# Patient Record
Sex: Female | Born: 1960
Health system: Southern US, Community
[De-identification: ages and names within clinical notes are randomized; demographics above are authoritative.]

## PROBLEM LIST (undated history)

## (undated) DIAGNOSIS — R7303 Prediabetes: Secondary | ICD-10-CM

## (undated) DIAGNOSIS — Z9989 Dependence on other enabling machines and devices: Secondary | ICD-10-CM

## (undated) DIAGNOSIS — G4730 Sleep apnea, unspecified: Secondary | ICD-10-CM

## (undated) DIAGNOSIS — E119 Type 2 diabetes mellitus without complications: Secondary | ICD-10-CM

## (undated) DIAGNOSIS — G43909 Migraine, unspecified, not intractable, without status migrainosus: Secondary | ICD-10-CM

## (undated) DIAGNOSIS — G4733 Obstructive sleep apnea (adult) (pediatric): Secondary | ICD-10-CM

## (undated) DIAGNOSIS — M1990 Unspecified osteoarthritis, unspecified site: Secondary | ICD-10-CM

## (undated) DIAGNOSIS — K219 Gastro-esophageal reflux disease without esophagitis: Secondary | ICD-10-CM

## (undated) DIAGNOSIS — N390 Urinary tract infection, site not specified: Secondary | ICD-10-CM

## (undated) DIAGNOSIS — I10 Essential (primary) hypertension: Secondary | ICD-10-CM

## (undated) DIAGNOSIS — H269 Unspecified cataract: Secondary | ICD-10-CM

## (undated) DIAGNOSIS — U071 COVID-19: Secondary | ICD-10-CM

## (undated) HISTORY — DX: Sleep apnea, unspecified: G47.30

## (undated) HISTORY — DX: Gastro-esophageal reflux disease without esophagitis: K21.9

## (undated) HISTORY — DX: Unspecified cataract: H26.9

## (undated) HISTORY — DX: Type 2 diabetes mellitus without complications: E11.9

## (undated) HISTORY — DX: COVID-19: U07.1

## (undated) HISTORY — DX: Prediabetes: R73.03

## (undated) HISTORY — DX: Unspecified osteoarthritis, unspecified site: M19.90

## (undated) HISTORY — DX: Obstructive sleep apnea (adult) (pediatric): G47.33

## (undated) HISTORY — PX: ABDOMINOPLASTY: SUR9

## (undated) HISTORY — DX: Essential (primary) hypertension: I10

## (undated) HISTORY — DX: Migraine, unspecified, not intractable, without status migrainosus: G43.909

## (undated) HISTORY — DX: Dependence on other enabling machines and devices: Z99.89

## (undated) HISTORY — PX: ABDOMINAL HYSTERECTOMY: SHX81

## (undated) HISTORY — DX: Urinary tract infection, site not specified: N39.0

---

## 2005-08-10 ENCOUNTER — Ambulatory Visit: Payer: Self-pay

## 2006-08-20 ENCOUNTER — Ambulatory Visit: Payer: Self-pay

## 2007-08-27 ENCOUNTER — Ambulatory Visit: Payer: Self-pay

## 2008-09-16 ENCOUNTER — Ambulatory Visit: Payer: Self-pay

## 2009-10-05 ENCOUNTER — Ambulatory Visit: Payer: Self-pay

## 2010-08-09 ENCOUNTER — Ambulatory Visit: Payer: Self-pay | Admitting: Unknown Physician Specialty

## 2010-11-02 ENCOUNTER — Ambulatory Visit: Payer: Self-pay

## 2011-10-17 ENCOUNTER — Ambulatory Visit: Payer: Self-pay | Admitting: Unknown Physician Specialty

## 2012-10-29 ENCOUNTER — Ambulatory Visit: Payer: Self-pay | Admitting: Unknown Physician Specialty

## 2013-11-02 ENCOUNTER — Ambulatory Visit: Payer: Self-pay | Admitting: Internal Medicine

## 2014-02-16 ENCOUNTER — Ambulatory Visit: Payer: Self-pay | Admitting: Specialist

## 2014-09-30 DIAGNOSIS — K219 Gastro-esophageal reflux disease without esophagitis: Secondary | ICD-10-CM | POA: Insufficient documentation

## 2014-10-05 LAB — HM COLONOSCOPY

## 2014-11-08 ENCOUNTER — Ambulatory Visit: Payer: Self-pay | Admitting: Internal Medicine

## 2015-07-23 DIAGNOSIS — R42 Dizziness and giddiness: Secondary | ICD-10-CM | POA: Insufficient documentation

## 2015-10-03 ENCOUNTER — Other Ambulatory Visit: Payer: Self-pay | Admitting: Internal Medicine

## 2015-10-03 DIAGNOSIS — E119 Type 2 diabetes mellitus without complications: Secondary | ICD-10-CM | POA: Insufficient documentation

## 2015-10-03 DIAGNOSIS — R739 Hyperglycemia, unspecified: Secondary | ICD-10-CM | POA: Insufficient documentation

## 2015-10-03 DIAGNOSIS — Z1239 Encounter for other screening for malignant neoplasm of breast: Secondary | ICD-10-CM

## 2015-11-10 ENCOUNTER — Ambulatory Visit
Admission: RE | Admit: 2015-11-10 | Discharge: 2015-11-10 | Disposition: A | Discharge status: Home / Self Care | Payer: 59 | Source: Ambulatory Visit | Attending: Internal Medicine | Admitting: Internal Medicine

## 2015-11-10 DIAGNOSIS — Z1231 Encounter for screening mammogram for malignant neoplasm of breast: Secondary | ICD-10-CM | POA: Insufficient documentation

## 2015-11-10 DIAGNOSIS — Z1239 Encounter for other screening for malignant neoplasm of breast: Secondary | ICD-10-CM

## 2016-10-02 ENCOUNTER — Other Ambulatory Visit: Payer: Self-pay | Admitting: Internal Medicine

## 2016-10-03 ENCOUNTER — Other Ambulatory Visit: Payer: Self-pay | Admitting: Internal Medicine

## 2016-10-03 DIAGNOSIS — Z1231 Encounter for screening mammogram for malignant neoplasm of breast: Secondary | ICD-10-CM

## 2016-10-24 ENCOUNTER — Ambulatory Visit
Admission: RE | Admit: 2016-10-24 | Discharge: 2016-10-24 | Disposition: A | Discharge status: Home / Self Care | Payer: 59 | Source: Ambulatory Visit | Attending: Internal Medicine | Admitting: Internal Medicine

## 2016-10-24 DIAGNOSIS — Z1231 Encounter for screening mammogram for malignant neoplasm of breast: Secondary | ICD-10-CM | POA: Diagnosis not present

## 2017-04-19 DIAGNOSIS — R202 Paresthesia of skin: Secondary | ICD-10-CM | POA: Diagnosis not present

## 2017-04-19 DIAGNOSIS — R200 Anesthesia of skin: Secondary | ICD-10-CM | POA: Diagnosis not present

## 2017-09-30 LAB — HM HEPATITIS C SCREENING LAB: HM Hepatitis Screen: NEGATIVE

## 2017-10-01 ENCOUNTER — Other Ambulatory Visit: Payer: Self-pay | Admitting: Internal Medicine

## 2017-10-01 DIAGNOSIS — Z1231 Encounter for screening mammogram for malignant neoplasm of breast: Secondary | ICD-10-CM

## 2017-10-25 ENCOUNTER — Ambulatory Visit
Admission: RE | Admit: 2017-10-25 | Discharge: 2017-10-25 | Disposition: A | Discharge status: Home / Self Care | Payer: 59 | Source: Ambulatory Visit | Attending: Internal Medicine | Admitting: Internal Medicine

## 2017-10-25 DIAGNOSIS — Z1231 Encounter for screening mammogram for malignant neoplasm of breast: Secondary | ICD-10-CM | POA: Insufficient documentation

## 2017-11-08 DIAGNOSIS — G4733 Obstructive sleep apnea (adult) (pediatric): Secondary | ICD-10-CM | POA: Diagnosis not present

## 2017-12-10 DIAGNOSIS — R251 Tremor, unspecified: Secondary | ICD-10-CM | POA: Diagnosis not present

## 2017-12-10 DIAGNOSIS — E119 Type 2 diabetes mellitus without complications: Secondary | ICD-10-CM | POA: Diagnosis not present

## 2017-12-10 DIAGNOSIS — Z0000 Encounter for general adult medical examination without abnormal findings: Secondary | ICD-10-CM | POA: Diagnosis not present

## 2018-01-13 DIAGNOSIS — G250 Essential tremor: Secondary | ICD-10-CM | POA: Diagnosis not present

## 2018-02-10 DIAGNOSIS — G4733 Obstructive sleep apnea (adult) (pediatric): Secondary | ICD-10-CM | POA: Diagnosis not present

## 2018-05-27 DIAGNOSIS — G250 Essential tremor: Secondary | ICD-10-CM | POA: Diagnosis not present

## 2018-05-27 DIAGNOSIS — R51 Headache: Secondary | ICD-10-CM | POA: Diagnosis not present

## 2018-05-27 DIAGNOSIS — R519 Headache, unspecified: Secondary | ICD-10-CM | POA: Insufficient documentation

## 2018-05-28 DIAGNOSIS — E119 Type 2 diabetes mellitus without complications: Secondary | ICD-10-CM | POA: Diagnosis not present

## 2018-06-04 DIAGNOSIS — I10 Essential (primary) hypertension: Secondary | ICD-10-CM | POA: Diagnosis not present

## 2018-06-04 DIAGNOSIS — E119 Type 2 diabetes mellitus without complications: Secondary | ICD-10-CM | POA: Diagnosis not present

## 2018-06-20 ENCOUNTER — Encounter: Payer: 59 | Attending: Internal Medicine | Admitting: *Deleted

## 2018-06-20 ENCOUNTER — Encounter: Payer: Self-pay | Admitting: *Deleted

## 2018-06-20 VITALS — BP 134/78 | Ht 67.0 in | Wt 184.0 lb

## 2018-06-20 DIAGNOSIS — E119 Type 2 diabetes mellitus without complications: Secondary | ICD-10-CM | POA: Diagnosis not present

## 2018-06-20 DIAGNOSIS — Z713 Dietary counseling and surveillance: Secondary | ICD-10-CM | POA: Insufficient documentation

## 2018-06-20 NOTE — Patient Instructions (Signed)
Exercise: Continue waling for  30  minutes   3  days a week and gradually increase to 30 minutes 5 x week  Eat 3 meals day, 1-2 snacks a day Space meals 4-6 hours apart Don't skip meals  Return for classes on:

## 2018-06-20 NOTE — Progress Notes (Signed)
Diabetes Self-Management Education  Visit Type: First/Initial  Appt. Start Time: 1040 Appt. End Time: 1200  06/20/2018  [name], identified by name and date of birth, is a 57 y.o. female with a diagnosis of Diabetes: Type 2.   ASSESSMENT  Blood pressure 134/78, height 5\' 7"  (1.702 m), weight 184 lb (83.5 kg). Body mass index is 28.82 kg/m.  Diabetes Self-Management Education - 06/20/18 1311      Visit Information   Visit Type  First/Initial      Initial Visit   Diabetes Type  Type 2    Are you currently following a meal plan?  Yes    What type of meal plan do you follow?  "cut back on sweets"    Are you taking your medications as prescribed?  Yes    Date Diagnosed  Jan 2019 - A1C was also 6.5 % in 2018      Health Coping   How would you rate your overall health?  Good      Psychosocial Assessment   Patient Belief/Attitude about Diabetes  Other (comment) "stressed"   Self-care barriers  None    Self-management support  Doctor's office;Family    Patient Concerns  Nutrition/Meal planning;Glycemic Control;Weight Control    Special Needs  None    Preferred Learning Style  Auditory    Learning Readiness  Ready    How often do you need to have someone help you when you read instructions, pamphlets, or other written materials from your doctor or pharmacy?  1 - Never    What is the last grade level you completed in school?  college      Pre-Education Assessment   Patient understands the diabetes disease and treatment process.  Needs Instruction    Patient understands incorporating nutritional management into lifestyle.  Needs Instruction    Patient undertands incorporating physical activity into lifestyle.  Needs Instruction    Patient understands using medications safely.  Needs Instruction    Patient understands monitoring blood glucose, interpreting and using results  Needs Instruction    Patient understands prevention, detection, and treatment of acute  complications.  Needs Instruction    Patient understands prevention, detection, and treatment of chronic complications.  Needs Instruction    Patient understands how to develop strategies to address psychosocial issues.  Needs Instruction    Patient understands how to develop strategies to promote health/change behavior.  Needs Instruction      Complications   Last HgB A1C per patient/outside source  6.4 %  05/28/18   How often do you check your blood sugar?  Patient declines    Have you had a dilated eye exam in the past 12 months?  Yes    Have you had a dental exam in the past 12 months?  Yes    Are you checking your feet?  No      Dietary Intake   Breakfast  Eats out for most breakfast meals - [employer] sausage burrito or [employer] or [employer] or oatmeal or cereal or bacon/eggs    Snack (morning)  popcorn or peanut butter or crackers    Lunch  eats out for most lunch meals - salads, grilled chicken, hamburger, meat and veggies (steak, cabbage, pintos), greens, squash, tuna salad, salmon patties    Snack (afternoon)  same as morning sanck    Dinner  skips or eats snack - trail mix, fruit, tomato sandwich    Snack (evening)  trail mix, fruit  Beverage(s)  water, coffee      Exercise   Exercise Type  Light (walking / raking leaves)    How many days per week to you exercise?  3    How many minutes per day do you exercise?  30    Total minutes per week of exercise  90      Patient Education   Previous Diabetes Education  No    Disease state   Definition of diabetes, type 1 and 2, and the diagnosis of diabetes;Factors that contribute to the development of diabetes    Nutrition management   Role of diet in the treatment of diabetes and the relationship between the three main macronutrients and blood glucose level;Food label reading, portion sizes and measuring food.;Information on hints to eating out and maintain blood glucose control.    Physical activity and exercise   Role of  exercise on diabetes management, blood pressure control and cardiac health.    Monitoring  Identified appropriate SMBG and/or A1C goals.    Chronic complications  Relationship between chronic complications and blood glucose control;Retinopathy and reason for yearly dilated eye exams    Psychosocial adjustment  Role of stress on diabetes;Identified and addressed patients feelings and concerns about diabetes      Individualized Goals (developed by patient)   Reducing Risk Improve blood sugars Lose weight Become more fit     Outcomes   Expected Outcomes  Demonstrated interest in learning. Expect positive outcomes    Future DMSE  2 months       Individualized Plan for Diabetes Self-Management Training:   Learning Objective:  Patient will have a greater understanding of diabetes self-management. Patient education plan is to attend individual and/or group sessions per assessed needs and concerns.   Plan:   Patient Instructions  Exercise: Continue walking for  30  minutes   3  days a week and gradually increase to 30 minutes 5 x week Eat 3 meals day, 1-2 snacks a day Space meals 4-6 hours apart Don't skip meals Return for classes on:    Expected Outcomes:  Demonstrated interest in learning. Expect positive outcomes  Education material provided:  General Meal Planning Guidelines Simple Meal Plan  If problems or questions, patient to contact team via:   Sharion SettlerSheila Maurie Olesen, RN, CCM, CDE [phone]  Future DSME appointment: 2 months  August 18, 2018 for Diabetes Class 1

## 2018-07-17 DIAGNOSIS — G4733 Obstructive sleep apnea (adult) (pediatric): Secondary | ICD-10-CM | POA: Diagnosis not present

## 2018-08-18 ENCOUNTER — Encounter: Payer: 59 | Attending: Internal Medicine | Admitting: Dietician

## 2018-08-18 ENCOUNTER — Encounter: Payer: Self-pay | Admitting: Dietician

## 2018-08-18 VITALS — Ht 67.0 in | Wt 182.7 lb

## 2018-08-18 DIAGNOSIS — E119 Type 2 diabetes mellitus without complications: Secondary | ICD-10-CM | POA: Insufficient documentation

## 2018-08-18 DIAGNOSIS — Z713 Dietary counseling and surveillance: Secondary | ICD-10-CM | POA: Diagnosis not present

## 2018-08-18 NOTE — Progress Notes (Signed)

## 2018-08-25 VITALS — Wt 184.0 lb

## 2018-08-25 DIAGNOSIS — E119 Type 2 diabetes mellitus without complications: Secondary | ICD-10-CM

## 2018-08-25 DIAGNOSIS — Z713 Dietary counseling and surveillance: Secondary | ICD-10-CM | POA: Diagnosis not present

## 2018-08-25 NOTE — Progress Notes (Signed)

## 2018-09-01 ENCOUNTER — Encounter: Payer: Self-pay | Admitting: Dietician

## 2018-09-01 ENCOUNTER — Encounter: Payer: 59 | Admitting: Dietician

## 2018-09-01 VITALS — BP 130/78 | Ht 67.0 in | Wt 185.1 lb

## 2018-09-01 DIAGNOSIS — E119 Type 2 diabetes mellitus without complications: Secondary | ICD-10-CM

## 2018-09-01 DIAGNOSIS — Z713 Dietary counseling and surveillance: Secondary | ICD-10-CM | POA: Diagnosis not present

## 2018-09-01 NOTE — Progress Notes (Signed)

## 2018-09-03 ENCOUNTER — Encounter: Payer: Self-pay | Admitting: *Deleted

## 2018-09-15 ENCOUNTER — Other Ambulatory Visit: Payer: Self-pay | Admitting: Internal Medicine

## 2018-09-15 DIAGNOSIS — Z1231 Encounter for screening mammogram for malignant neoplasm of breast: Secondary | ICD-10-CM

## 2018-10-28 ENCOUNTER — Ambulatory Visit
Admission: RE | Admit: 2018-10-28 | Discharge: 2018-10-28 | Disposition: A | Discharge status: Home / Self Care | Payer: BLUE CROSS/BLUE SHIELD | Source: Ambulatory Visit | Attending: Internal Medicine | Admitting: Internal Medicine

## 2018-10-28 DIAGNOSIS — Z1231 Encounter for screening mammogram for malignant neoplasm of breast: Secondary | ICD-10-CM | POA: Diagnosis not present

## 2018-11-10 DIAGNOSIS — Z13220 Encounter for screening for lipoid disorders: Secondary | ICD-10-CM | POA: Diagnosis not present

## 2018-11-10 DIAGNOSIS — I10 Essential (primary) hypertension: Secondary | ICD-10-CM | POA: Diagnosis not present

## 2018-11-10 DIAGNOSIS — E119 Type 2 diabetes mellitus without complications: Secondary | ICD-10-CM | POA: Diagnosis not present

## 2018-11-14 DIAGNOSIS — R8290 Unspecified abnormal findings in urine: Secondary | ICD-10-CM | POA: Diagnosis not present

## 2018-11-17 DIAGNOSIS — N390 Urinary tract infection, site not specified: Secondary | ICD-10-CM | POA: Diagnosis not present

## 2018-11-17 DIAGNOSIS — I10 Essential (primary) hypertension: Secondary | ICD-10-CM | POA: Diagnosis not present

## 2018-11-17 DIAGNOSIS — R3129 Other microscopic hematuria: Secondary | ICD-10-CM | POA: Diagnosis not present

## 2018-11-17 DIAGNOSIS — B9620 Unspecified Escherichia coli [E. coli] as the cause of diseases classified elsewhere: Secondary | ICD-10-CM | POA: Diagnosis not present

## 2019-02-03 ENCOUNTER — Other Ambulatory Visit: Payer: Self-pay

## 2019-02-03 ENCOUNTER — Encounter: Payer: Self-pay | Admitting: Internal Medicine

## 2019-02-03 ENCOUNTER — Ambulatory Visit (INDEPENDENT_AMBULATORY_CARE_PROVIDER_SITE_OTHER): Payer: BLUE CROSS/BLUE SHIELD | Admitting: Internal Medicine

## 2019-02-03 VITALS — BP 150/80 | HR 70 | Temp 97.9°F | Ht 67.0 in | Wt 185.2 lb

## 2019-02-03 DIAGNOSIS — N3001 Acute cystitis with hematuria: Secondary | ICD-10-CM

## 2019-02-03 DIAGNOSIS — I10 Essential (primary) hypertension: Secondary | ICD-10-CM | POA: Diagnosis not present

## 2019-02-03 DIAGNOSIS — R067 Sneezing: Secondary | ICD-10-CM

## 2019-02-03 DIAGNOSIS — Z1329 Encounter for screening for other suspected endocrine disorder: Secondary | ICD-10-CM

## 2019-02-03 DIAGNOSIS — R7303 Prediabetes: Secondary | ICD-10-CM

## 2019-02-03 DIAGNOSIS — M8580 Other specified disorders of bone density and structure, unspecified site: Secondary | ICD-10-CM

## 2019-02-03 DIAGNOSIS — G4733 Obstructive sleep apnea (adult) (pediatric): Secondary | ICD-10-CM

## 2019-02-03 DIAGNOSIS — E559 Vitamin D deficiency, unspecified: Secondary | ICD-10-CM | POA: Diagnosis not present

## 2019-02-03 DIAGNOSIS — Z9989 Dependence on other enabling machines and devices: Secondary | ICD-10-CM

## 2019-02-03 DIAGNOSIS — E785 Hyperlipidemia, unspecified: Secondary | ICD-10-CM

## 2019-02-03 MED ORDER — OLMESARTAN MEDOXOMIL-HCTZ 20-12.5 MG PO TABS: 1.0000 | ORAL_TABLET | Freq: Every day | ORAL | 1 refills | Status: DC

## 2019-02-03 MED ORDER — CALCIUM CARB-CHOLECALCIFEROL 600-800 MG-UNIT PO TABS: 1.0000 | ORAL_TABLET | Freq: Two times a day (BID) | ORAL | 3 refills | Status: DC

## 2019-02-03 NOTE — Progress Notes (Signed)
Pre visit review using our clinic review tool, if applicable. No additional management support is needed unless otherwise documented below in the visit note. 

## 2019-02-03 NOTE — Patient Instructions (Addendum)
New shingles shot shingrix 2 doses schedule nurse visit   rec calcium 600 mg 2x per day with food  Vitamin D 2000 IU D3  womens multivitamin  Or  Caltrate with vitamin D  Check your blood pressure daily and call back in 2 weeks   Cinnamon 500 mg daily   Allegra, zyrtec or claritin at night       Hypoglycemia Hypoglycemia occurs when the level of sugar (glucose) in the blood is too low. Hypoglycemia can happen in people who do or do not have diabetes. It can develop quickly, and it can be a medical emergency. For most people with diabetes, a blood glucose level below 70 mg/dL (3.9 mmol/L) is considered hypoglycemia. Glucose is a type of sugar that provides the body's main source of energy. Certain hormones (insulin and glucagon) control the level of glucose in the blood. Insulin lowers blood glucose, and glucagon raises blood glucose. Hypoglycemia can result from having too much insulin in the bloodstream, or from not eating enough food that contains glucose. You may also have reactive hypoglycemia, which happens within 4 hours after eating a meal. What are the causes? Hypoglycemia occurs most often in people who have diabetes and may be caused by:  Diabetes medicine.  Not eating enough, or not eating often enough.  Increased physical activity.  Drinking alcohol on an empty stomach. If you do not have diabetes, hypoglycemia may be caused by:  A tumor in the pancreas.  Not eating enough, or not eating for long periods at a time (fasting).  A severe infection or illness.  Certain medicines. What increases the risk? Hypoglycemia is more likely to develop in:  People who have diabetes and take medicines to lower blood glucose.  People who abuse alcohol.  People who have a severe illness. What are the signs or symptoms? Mild symptoms Mild hypoglycemia may not cause any symptoms. If you do have symptoms, they may include:  Hunger.  Anxiety.  Sweating and feeling  clammy.  Dizziness or feeling light-headed.  Sleepiness.  Nausea.  Increased heart rate.  Headache.  Blurry vision.  Irritability.  Tingling or numbness around the mouth, lips, or tongue.  A change in coordination.  Restless sleep. Moderate symptoms Moderate hypoglycemia can cause:  Mental confusion and poor judgment.  Behavior changes.  Weakness.  Irregular heartbeat. Severe symptoms Severe hypoglycemia is a medical emergency. It can cause:  Fainting.  Seizures.  Loss of consciousness (coma).  Death. How is this diagnosed? Hypoglycemia is diagnosed with a blood test to measure your blood glucose level. This blood test is done while you are having symptoms. Your health care provider may also do a physical exam and review your medical history. How is this treated? This condition can often be treated by immediately eating or drinking something that contains sugar, such as:  Fruit juice, 4-6 oz (120-150 mL).  Regular soda (not diet soda), 4-6 oz (120-150 mL).  Low-fat milk, 4 oz (120 mL).  Several pieces of hard candy.  Sugar or honey, 1 Tbsp (15 mL). Treating hypoglycemia if you have diabetes If you are alert and able to swallow safely, follow the 15:15 rule:  Take 15 grams of a rapid-acting carbohydrate. Talk with your health care provider about how much you should take.  Rapid-acting options include: ? Glucose pills (take 15 grams). ? 6-8 pieces of hard candy. ? 4-6 oz (120-150 mL) of fruit juice. ? 4-6 oz (120-150 mL) of regular (not diet) soda. ? 1 Tbsp (  15 mL) honey or sugar.  Check your blood glucose 15 minutes after you take the carbohydrate.  If the repeat blood glucose level is still at or below 70 mg/dL (3.9 mmol/L), take 15 grams of a carbohydrate again.  If your blood glucose level does not increase above 70 mg/dL (3.9 mmol/L) after 3 tries, seek emergency medical care.  After your blood glucose level returns to normal, eat a meal or  a snack within 1 hour.  Treating severe hypoglycemia Severe hypoglycemia is when your blood glucose level is at or below 54 mg/dL (3 mmol/L). Severe hypoglycemia is a medical emergency. Get medical help right away. If you have severe hypoglycemia and you cannot eat or drink, you may need an injection of glucagon. A family member or close friend should learn how to check your blood glucose and how to give you a glucagon injection. Ask your health care provider if you need to have an emergency glucagon injection kit available. Severe hypoglycemia may need to be treated in a hospital. The treatment may include getting glucose through an IV. You may also need treatment for the cause of your hypoglycemia. Follow these instructions at home:  General instructions  Take over-the-counter and prescription medicines only as told by your health care provider.  Monitor your blood glucose as told by your health care provider.  Limit alcohol intake to no more than 1 drink a day for nonpregnant women and 2 drinks a day for men. One drink equals 12 oz of beer (355 mL), 5 oz of wine (148 mL), or 1 oz of hard liquor (44 mL).  Keep all follow-up visits as told by your health care provider. This is important. If you have diabetes:  Always have a rapid-acting carbohydrate snack with you to treat low blood glucose.  Follow your diabetes management plan as directed. Make sure you: ? Know the symptoms of hypoglycemia. It is important to treat it right away to prevent it from becoming severe. ? Take your medicines as directed. ? Follow your exercise plan. ? Follow your meal plan. Eat on time, and do not skip meals. ? Check your blood glucose as often as directed. Always check before and after exercise. ? Follow your sick day plan whenever you cannot eat or drink normally. Make this plan in advance with your health care provider.  Share your diabetes management plan with people in your workplace, school, and  household.  Check your urine for ketones when you are ill and as told by your health care provider.  Carry a medical alert card or wear medical alert jewelry. Contact a health care provider if:  You have problems keeping your blood glucose in your target range.  You have frequent episodes of hypoglycemia. Get help right away if:  You continue to have hypoglycemia symptoms after eating or drinking something containing glucose.  Your blood glucose is at or below 54 mg/dL (3 mmol/L).  You have a seizure.  You faint. These symptoms may represent a serious problem that is an emergency. Do not wait to see if the symptoms will go away. Get medical help right away. Call your local emergency services (911 in the [place] Summary  Hypoglycemia occurs when the level of sugar (glucose) in the blood is too low.  Hypoglycemia can happen in people who do or do not have diabetes. It can develop quickly, and it can be a medical emergency.  Make sure you know the symptoms of hypoglycemia and how to treat it.  Always have a rapid-acting carbohydrate snack with you to treat low blood sugar. This information is not intended to replace advice given to you by your health care provider. Make sure you discuss any questions you have with your health care provider. Document Released: 10/29/2005 Document Revised: 04/22/2018 Document Reviewed: 12/02/2015 Elsevier Interactive Patient Education  2019 Else[employer] Inc.  Cholesterol Cholesterol is a white, waxy, fat-like substance that is needed by the human body in small amounts. The liver makes all the cholesterol we need. Cholesterol is carried from the liver by the blood through the blood vessels. Deposits of cholesterol (plaques) may build up on blood vessel (artery) walls. Plaques make the arteries narrower and stiffer. Cholesterol plaques increase the risk for heart attack and stroke. You cannot feel your cholesterol level even if it is very high. The only way  to know that it is high is to have a blood test. Once you know your cholesterol levels, you should keep a record of the test results. Work with your health care provider to keep your levels in the desired range. What do the results mean?  Total cholesterol is a rough measure of all the cholesterol in your blood.  LDL (low-density lipoprotein) is the "bad" cholesterol. This is the type that causes plaque to build up on the artery walls. You want this level to be low.  HDL (high-density lipoprotein) is the "good" cholesterol because it cleans the arteries and carries the LDL away. You want this level to be high.  Triglycerides are fat that the body can either burn for energy or store. High levels are closely linked to heart disease. What are the desired levels of cholesterol?  Total cholesterol below 200.  LDL below 100 for people who are at risk, below 70 for people at very high risk.  HDL above 40 is good. A level of 60 or higher is considered to be protective against heart disease.  Triglycerides below 150. How can I lower my cholesterol? Diet Follow your diet program as told by your health care provider.  Choose fish or white meat chicken and [place], roasted or baked. Limit fatty cuts of red meat, fried foods, and processed meats, such as sausage and lunch meats.  Eat lots of fresh fruits and vegetables.  Choose whole grains, beans, pasta, potatoes, and cereals.  Choose olive oil, corn oil, or canola oil, and use only small amounts.  Avoid butter, mayonnaise, shortening, or palm kernel oils.  Avoid foods with trans fats.  Drink skim or nonfat milk and eat low-fat or nonfat yogurt and cheeses. Avoid whole milk, cream, ice cream, egg yolks, and full-fat cheeses.  Healthier desserts include angel food cake, ginger snaps, animal crackers, hard candy, popsicles, and low-fat or nonfat frozen yogurt. Avoid pastries, cakes, pies, and cookies.  Exercise  Follow your exercise program  as told by your health care provider. A regular program: ? Helps to decrease LDL and raise HDL. ? Helps with weight control.  Do things that increase your activity level, such as gardening, walking, and taking the stairs.  Ask your health care provider about ways that you can be more active in your daily life. Medicine  Take over-the-counter and prescription medicines only as told by your health care provider. ? Medicine may be prescribed by your health care provider to help lower cholesterol and decrease the risk for heart disease. This is usually done if diet and exercise have failed to bring down cholesterol levels. ? If you have several risk  factors, you may need medicine even if your levels are normal. This information is not intended to replace advice given to you by your health care provider. Make sure you discuss any questions you have with your health care provider. Document Released: 07/24/2001 Document Revised: 05/26/2016 Document Reviewed: 04/28/2016 Elsevier Interactive Patient Education  2019 [employer].  Diabetes Mellitus and Nutrition, Adult When you have diabetes (diabetes mellitus), it is very important to have healthy eating habits because your blood sugar (glucose) levels are greatly affected by what you eat and drink. Eating healthy foods in the appropriate amounts, at about the same times every day, can help you:  Control your blood glucose.  Lower your risk of heart disease.  Improve your blood pressure.  Reach or maintain a healthy weight. Every person with diabetes is different, and each person has different needs for a meal plan. Your health care provider may recommend that you work with a diet and nutrition specialist (dietitian) to make a meal plan that is best for you. Your meal plan may vary depending on factors such as:  The calories you need.  The medicines you take.  Your weight.  Your blood glucose, blood pressure, and cholesterol levels.  Your  activity level.  Other health conditions you have, such as heart or kidney disease. How do carbohydrates affect me? Carbohydrates, also called carbs, affect your blood glucose level more than any other type of food. Eating carbs naturally raises the amount of glucose in your blood. Carb counting is a method for keeping track of how many carbs you eat. Counting carbs is important to keep your blood glucose at a healthy level, especially if you use insulin or take certain oral diabetes medicines. It is important to know how many carbs you can safely have in each meal. This is different for every person. Your dietitian can help you calculate how many carbs you should have at each meal and for each snack. Foods that contain carbs include:  Bread, cereal, rice, pasta, and crackers.  Potatoes and corn.  Peas, beans, and lentils.  Milk and yogurt.  Fruit and juice.  Desserts, such as cakes, cookies, ice cream, and candy. How does alcohol affect me? Alcohol can cause a sudden decrease in blood glucose (hypoglycemia), especially if you use insulin or take certain oral diabetes medicines. Hypoglycemia can be a life-threatening condition. Symptoms of hypoglycemia (sleepiness, dizziness, and confusion) are similar to symptoms of having too much alcohol. If your health care provider says that alcohol is safe for you, follow these guidelines:  Limit alcohol intake to no more than 1 drink per day for nonpregnant women and 2 drinks per day for men. One drink equals 12 oz of beer, 5 oz of wine, or 1 oz of hard liquor.  Do not drink on an empty stomach.  Keep yourself hydrated with water, diet soda, or unsweetened iced tea.  Keep in mind that regular soda, juice, and other mixers may contain a lot of sugar and must be counted as carbs. What are tips for following this plan?  Reading food labels  Start by checking the serving size on the "Nutrition Facts" label of packaged foods and drinks. The  amount of calories, carbs, fats, and other nutrients listed on the label is based on one serving of the item. Many items contain more than one serving per package.  Check the total grams (g) of carbs in one serving. You can calculate the number of servings of carbs in one  serving by dividing the total carbs by 15. For example, if a food has 30 g of total carbs, it would be equal to 2 servings of carbs.  Check the number of grams (g) of saturated and trans fats in one serving. Choose foods that have low or no amount of these fats.  Check the number of milligrams (mg) of salt (sodium) in one serving. Most people should limit total sodium intake to less than 2,300 mg per day.  Always check the nutrition information of foods labeled as "low-fat" or "nonfat". These foods may be higher in added sugar or refined carbs and should be avoided.  Talk to your dietitian to identify your daily goals for nutrients listed on the label. Shopping  Avoid buying canned, premade, or processed foods. These foods tend to be high in fat, sodium, and added sugar.  Shop around the outside edge of the grocery store. This includes fresh fruits and vegetables, bulk grains, fresh meats, and fresh dairy. Cooking  Use low-heat cooking methods, such as baking, instead of high-heat cooking methods like deep frying.  Cook using healthy oils, such as olive, canola, or sunflower oil.  Avoid cooking with butter, cream, or high-fat meats. Meal planning  Eat meals and snacks regularly, preferably at the same times every day. Avoid going long periods of time without eating.  Eat foods high in fiber, such as fresh fruits, vegetables, beans, and whole grains. Talk to your dietitian about how many servings of carbs you can eat at each meal.  Eat 4-6 ounces (oz) of lean protein each day, such as lean meat, chicken, fish, eggs, or tofu. One oz of lean protein is equal to: ? 1 oz of meat, chicken, or fish. ? 1 egg. ?  cup of  tofu.  Eat some foods each day that contain healthy fats, such as avocado, nuts, seeds, and fish. Lifestyle  Check your blood glucose regularly.  Exercise regularly as told by your health care provider. This may include: ? 150 minutes of moderate-intensity or vigorous-intensity exercise each week. This could be brisk walking, biking, or water aerobics. ? Stretching and doing strength exercises, such as yoga or weightlifting, at least 2 times a week.  Take medicines as told by your health care provider.  Do not use any products that contain nicotine or tobacco, such as cigarettes and e-cigarettes. If you need help quitting, ask your health care provider.  Work with a Social worker or diabetes educator to identify strategies to manage stress and any emotional and social challenges. Questions to ask a health care provider  Do I need to meet with a diabetes educator?  Do I need to meet with a dietitian?  What number can I call if I have questions?  When are the best times to check my blood glucose? Where to find more information:  American Diabetes Association: diabetes.org  Academy of Nutrition and Dietetics: www.eatright.[employer] of Diabetes and Digestive and Kidney Diseases (NIH): [employer] Summary  A healthy meal plan will help you control your blood glucose and maintain a healthy lifestyle.  Working with a diet and nutrition specialist (dietitian) can help you make a meal plan that is best for you.  Keep in mind that carbohydrates (carbs) and alcohol have immediate effects on your blood glucose levels. It is important to count carbs and to use alcohol carefully. This information is not intended to replace advice given to you by your health care provider. Make sure you discuss any questions you  have with your health care provider. Document Released: 07/26/2005 Document Revised: 05/29/2017 Document Reviewed: 12/03/2016 Elsevier Interactive Patient Education   2019 Elsevier Inc.   Recombinant Zoster (Shingles) Vaccine, RZV: What You Need to Know 1. Why get vaccinated? Shingles (also called herpes zoster, or just zoster) is a painful skin rash, often with blisters. Shingles is caused by the varicella zoster virus, the same virus that causes chickenpox. After you have chickenpox, the virus stays in your body and can cause shingles later in life. You can't catch shingles from another person. However, a person who has never had chickenpox (or chickenpox vaccine) could get chickenpox from someone with shingles. A shingles rash usually appears on one side of the face or body and heals within 2 to 4 weeks. Its main symptom is pain, which can be severe. Other symptoms can include fever, headache, chills, and upset stomach. Very rarely, a shingles infection can lead to pneumonia, hearing problems, blindness, brain inflammation (encephalitis), or death. For about 1 person in 5, severe pain can continue even long after the rash has cleared up. This long-lasting pain is called post-herpetic neuralgia (PHN). Shingles is far more common in people 33 years of age and older than in younger people, and the risk increases with age. It is also more common in people whose immune system is weakened because of a disease such as cancer, or by drugs such as steroids or chemotherapy. At least 1 million people a year in the [place] get shingles. 2. Shingles vaccine (recombinant) Recombinant shingles vaccine was approved by FDA in 2017 for the prevention of shingles. In clinical trials, it was more than 90% effective in preventing shingles. It can also reduce the likelihood of PHN. Two doses, 2 to 6 months apart, are recommended for adults 61 and older. This vaccine is also recommended for people who have already gotten the live shingles vaccine (Zostavax). There is no live virus in this vaccine. 3. Some people should not get this vaccine Tell your vaccine provider if  you:  Have any severe, life-threatening allergies. A person who has ever had a life-threatening allergic reaction after a dose of recombinant shingles vaccine, or has a severe allergy to any component of this vaccine, may be advised not to be vaccinated. Ask your health care provider if you want information about vaccine components.  Are pregnant or breastfeeding. There is not much information about use of recombinant shingles vaccine in pregnant or nursing women. Your healthcare provider might recommend delaying vaccination.  Are not feeling well. If you have a mild illness, such as a cold, you can probably get the vaccine today. If you are moderately or severely ill, you should probably wait until you recover. Your doctor can advise you. 4. Risks of a vaccine reaction With any medicine, including vaccines, there is a chance of reactions. After recombinant shingles vaccination, a person might experience:  Pain, redness, soreness, or swelling at the site of the injection  Headache, muscle aches, fever, shivering, fatigue In clinical trials, most people got a sore arm with mild or moderate pain after vaccination, and some also had redness and swelling where they got the shot. Some people felt tired, had muscle pain, a headache, shivering, fever, stomach pain, or nausea. About 1 out of 6 people who got recombinant zoster vaccine experienced side effects that prevented them from doing regular activities. Symptoms went away on their own in about 2 to 3 days. Side effects were more common in younger people. You should  still get the second dose of recombinant zoster vaccine even if you had one of these reactions after the first dose. Other things that could happen after this vaccine:  People sometimes faint after medical procedures, including vaccination. Sitting or lying down for about 15 minutes can help prevent fainting and injuries caused by a fall. Tell your provider if you feel dizzy or have vision  changes or ringing in the ears.  Some people get shoulder pain that can be more severe and longer-lasting than routine soreness that can follow injections. This happens very rarely.  Any medication can cause a severe allergic reaction. Such reactions to a vaccine are estimated at about 1 in a million doses, and would happen within a few minutes to a few hours after the vaccination. As with any medicine, there is a very remote chance of a vaccine causing a serious injury or death. The safety of vaccines is always being monitored. For more information, visit: http://www.aguilar.org/ 5. What if there is a serious problem? What should I look for?  Look for anything that concerns you, such as signs of a severe allergic reaction, very high fever, or unusual behavior. Signs of a severe allergic reaction can include hives, swelling of the face and throat, difficulty breathing, a fast heartbeat, dizziness, and weakness. These would usually start a few minutes to a few hours after the vaccination. What should I do?  If you think it is a severe allergic reaction or other emergency that can't wait, call 9-1-1 or get to the nearest hospital. Otherwise, call your health care provider. Afterward, the reaction should be reported to the Vaccine Adverse Event Reporting System (VAERS). Your doctor should file this report, or you can do it yourself through the VAERS website at www.vaers.[employer].es, or by calling 781-012-1919. VAERS does not give medical advice. 6. How can I learn more?  Ask your health care provider. He or she can give you the vaccine package insert or suggest other sources of information.  Call your local or state health department.  Contact the Centers for Disease Control and Prevention (CDC): ? Call (206)554-8345 (1-800-CDC-INFO) or ? Visit CDC's vaccines website at http://hunter.com/ CDC Vaccine Information Statement Recombinant Zoster Vaccine (12/24/2016) This information is not  intended to replace advice given to you by your health care provider. Make sure you discuss any questions you have with your health care provider. Document Released: 01/08/2017 Document Revised: 06/04/2018 Document Reviewed: 06/04/2018 Elsevier Interactive Patient Education  2019 Elsevier Inc.    Tremor A tremor is trembling or shaking that you cannot control. Most tremors affect the hands or arms. Tremors can also affect the head, vocal cords, face, and other parts of the body. There are many types of tremors. Common types include:  Essential tremor. These usually occur in people older than 40. It may run in families and can happen in otherwise healthy people.  Resting tremor. These occur when the muscles are at rest, such as when your hands are resting in your lap. People with Parkinson's disease often have resting tremors.  Postural tremor. These occur when you try to hold a pose, such as keeping your hands outstretched.  Kinetic tremor. These occur during purposeful movement, such as trying to touch a finger to your nose.  Task-specific tremor. These may occur when you perform certain tasks such as writing, speaking, or standing.  Psychogenic tremor. These dramatically lessen or disappear when you are distracted. They can happen in people of all ages. Some types of tremors  have no known cause. Tremors can also be a symptom of nervous system problems (neurological disorders) that may occur with aging. Some tremors go away with treatment, while others do not. Follow these instructions at home: Lifestyle      Limit alcohol intake to no more than 1 drink a day for nonpregnant women and 2 drinks a day for men. One drink equals 12 oz of beer, 5 oz of wine, or 1 oz of hard liquor.  Do not use any products that contain nicotine or tobacco, such as cigarettes and e-cigarettes. If you need help quitting, ask your health care provider.  Avoid extreme heat and extreme cold.  Limit your  caffeine intake, as told by your health care provider.  Try to get 8 hours of sleep each night.  Find ways to manage your stress, such as meditation or yoga. General instructions  Take over-the-counter and prescription medicines only as told by your health care provider.  Keep all follow-up visits as told by your health care provider. This is important. Contact a health care provider if you:  Develop a tremor after starting a new medicine.  Have a tremor along with other symptoms such as: ? Numbness. ? Tingling. ? Pain. ? Weakness.  Notice that your tremor gets worse.  Notice that your tremor interferes with your day-to-day life. Summary  A tremor is trembling or shaking that you cannot control.  Most tremors affect the hands or arms.  Some types of tremors have no known cause. Others may be a symptom of nervous system problems (neurological disorders).  Make sure you discuss any tremors you have with your health care provider. This information is not intended to replace advice given to you by your health care provider. Make sure you discuss any questions you have with your health care provider. Document Released: 10/19/2002 Document Revised: 08/29/2017 Document Reviewed: 08/29/2017 Elsevier Interactive Patient Education  2019 [place].  Gastroesophageal Reflux Disease, Adult Gastroesophageal reflux (GER) happens when acid from the stomach flows up into the tube that connects the mouth and the stomach (esophagus). Normally, food travels down the esophagus and stays in the stomach to be digested. However, when a person has GER, food and stomach acid sometimes move back up into the esophagus. If this becomes a more serious problem, the person may be diagnosed with a disease called gastroesophageal reflux disease (GERD). GERD occurs when the reflux:  Happens often.  Causes frequent or severe symptoms.  Causes problems such as damage to the esophagus. When stomach acid  comes in contact with the esophagus, the acid may cause soreness (inflammation) in the esophagus. Over time, GERD may create small holes (ulcers) in the lining of the esophagus. What are the causes? This condition is caused by a problem with the muscle between the esophagus and the stomach (lower esophageal sphincter, or LES). Normally, the LES muscle closes after food passes through the esophagus to the stomach. When the LES is weakened or abnormal, it does not close properly, and that allows food and stomach acid to go back up into the esophagus. The LES can be weakened by certain dietary substances, medicines, and medical conditions, including:  Tobacco use.  Pregnancy.  Having a hiatal hernia.  Alcohol use.  Certain foods and beverages, such as coffee, chocolate, onions, and peppermint. What increases the risk? You are more likely to develop this condition if you:  Have an increased body weight.  Have a connective tissue disorder.  Use NSAID medicines. What are  the signs or symptoms? Symptoms of this condition include:  Heartburn.  Difficult or painful swallowing.  The feeling of having a lump in the throat.  Abitter taste in the mouth.  Bad breath.  Having a large amount of saliva.  Having an upset or bloated stomach.  Belching.  Chest pain. Different conditions can cause chest pain. Make sure you see your health care provider if you experience chest pain.  Shortness of breath or wheezing.  Ongoing (chronic) cough or a night-time cough.  Wearing away of tooth enamel.  Weight loss. How is this diagnosed? Your health care provider will take a medical history and perform a physical exam. To determine if you have mild or severe GERD, your health care provider may also monitor how you respond to treatment. You may also have tests, including:  A test to examine your stomach and esophagus with a small camera (endoscopy).  A test thatmeasures the acidity level in  your esophagus.  A test thatmeasures how much pressure is on your esophagus.  A barium swallow or modified barium swallow test to show the shape, size, and functioning of your esophagus. How is this treated? The goal of treatment is to help relieve your symptoms and to prevent complications. Treatment for this condition may vary depending on how severe your symptoms are. Your health care provider may recommend:  Changes to your diet.  Medicine.  Surgery. Follow these instructions at home: Eating and drinking   Follow a diet as recommended by your health care provider. This may involve avoiding foods and drinks such as: ? Coffee and tea (with or without caffeine). ? Drinks that containalcohol. ? Energy drinks and sports drinks. ? Carbonated drinks or sodas. ? Chocolate and cocoa. ? Peppermint and mint flavorings. ? Garlic and onions. ? Horseradish. ? Spicy and acidic foods, including peppers, chili powder, curry powder, vinegar, hot sauces, and barbecue sauce. ? Citrus fruit juices and citrus fruits, such as oranges, lemons, and limes. ? Tomato-based foods, such as red sauce, chili, salsa, and pizza with red sauce. ? Fried and fatty foods, such as donuts, french fries, potato chips, and high-fat dressings. ? High-fat meats, such as hot dogs and fatty cuts of red and white meats, such as rib eye steak, sausage, ham, and bacon. ? High-fat dairy items, such as whole milk, butter, and cream cheese.  Eat small, frequent meals instead of large meals.  Avoid drinking large amounts of liquid with your meals.  Avoid eating meals during the 2-3 hours before bedtime.  Avoid lying down right after you eat.  Do not exercise right after you eat. Lifestyle   Do not use any products that contain nicotine or tobacco, such as cigarettes, e-cigarettes, and chewing tobacco. If you need help quitting, ask your health care provider.  Try to reduce your stress by using methods such as yoga  or meditation. If you need help reducing stress, ask your health care provider.  If you are overweight, reduce your weight to an amount that is healthy for you. Ask your health care provider for guidance about a safe weight loss goal. General instructions  Pay attention to any changes in your symptoms.  Take over-the-counter and prescription medicines only as told by your health care provider. Do not take aspirin, ibuprofen, or other NSAIDs unless your health care provider told you to do so.  Wear loose-fitting clothing. Do not wear anything tight around your waist that causes pressure on your abdomen.  Raise (elevate) the head  of your bed about 6 inches (15 cm).  Avoid bending over if this makes your symptoms worse.  Keep all follow-up visits as told by your health care provider. This is important. Contact a health care provider if:  You have: ? New symptoms. ? Unexplained weight loss. ? Difficulty swallowing or it hurts to swallow. ? Wheezing or a persistent cough. ? A hoarse voice.  Your symptoms do not improve with treatment. Get help right away if you:  Have pain in your arms, neck, jaw, teeth, or back.  Feel sweaty, dizzy, or light-headed.  Have chest pain or shortness of breath.  Vomit and your vomit looks like blood or coffee grounds.  Faint.  Have stool that is bloody or black.  Cannot swallow, drink, or eat. Summary  Gastroesophageal reflux happens when acid from the stomach flows up into the esophagus. GERD is a disease in which the reflux happens often, causes frequent or severe symptoms, or causes problems such as damage to the esophagus.  Treatment for this condition may vary depending on how severe your symptoms are. Your health care provider may recommend diet and lifestyle changes, medicine, or surgery.  Contact a health care provider if you have new or worsening symptoms.  Take over-the-counter and prescription medicines only as told by your health  care provider. Do not take aspirin, ibuprofen, or other NSAIDs unless your health care provider told you to do so.  Keep all follow-up visits as told by your health care provider. This is important. This information is not intended to replace advice given to you by your health care provider. Make sure you discuss any questions you have with your health care provider. Document Released: 08/08/2005 Document Revised: 05/07/2018 Document Reviewed: 05/07/2018 Elsevier Interactive Patient Education  2019 [employer].    Budget-Friendly Healthy Eating There are many ways to save money at the grocery store and continue to eat healthy. You can be successful if you:  Plan meals according to your budget.  Make a grocery list and only purchase food according to your grocery list.  Prepare food yourself. What are tips for following this plan?  Reading food labels  Compare food labels between brand name foods and the store brand. Often the nutritional value is the same, but the store brand is lower cost.  Look for products that do not have added sugar, fat, or salt (sodium). These often cost the same but are healthier for you. Products may be labeled as: ? Sugar-free. ? Nonfat. ? Low-fat. ? Sodium-free. ? Low-sodium.  Look for lean ground beef labeled as at least 92% lean and 8% fat. Shopping  Buy only the items on your grocery list and go only to the areas of the store that have the items on your list.  Use coupons only for foods and brands you normally buy. Avoid buying items you wouldn't normally buy simply because they are on sale.  Check online and in newspapers for weekly deals.  Buy healthy items from the bulk bins when available, such as herbs, spices, flour, pasta, nuts, and dried fruit.  Buy fruits and vegetables that are in season. Prices are usually lower on in-season produce.  Look at the unit price on the price tag. Use it to compare different brands and sizes to find out  which item is the best deal.  Choose healthy items that are often low-cost, such as carrots, potatoes, apples, bananas, and oranges. Dried or canned beans are a low-cost protein source.  Buy  in bulk and freeze extra food. Items you can buy in bulk include meats, fish, poultry, frozen fruits, and frozen vegetables.  Avoid buying "ready-to-eat" foods, such as pre-cut fruits and vegetables and pre-made salads.  If possible, shop around to discover where you can find the best prices. Consider other retailers such as dollar stores, larger [employer], local fruit and vegetable stands, and farmers markets.  Do not shop when you are hungry. If you shop while hungry, it may be hard to stick to your list and budget.  Resist impulse buying. Use your grocery list as your official plan for the week.  Buy a variety of vegetables and fruits by purchasing fresh, frozen, and canned items.  Look at the top and bottom shelves for deals. Foods at eye level (eye level of an adult or child) are usually more expensive.  Be efficient with your time when shopping. The more time you spend at the store, the more money you are likely to spend.  To save money when choosing more expensive foods like meats and dairy: ? Choose cheaper cuts of meat, such as bone-in chicken thighs and drumsticks instead of skinless and boneless chicken. When you are ready to prepare the chicken, you can remove the skin yourself to make it healthier. ? Choose lean meats like chicken or [place] instead of beef. ? Choose canned seafood, such as tuna, salmon, or sardines. ? Buy eggs as a low-cost source of protein. ? Buy dried beans and peas, such as lentils, split peas, or kidney beans instead of meats. Dried beans and peas are a good alternative source of protein. ? Buy the larger tubs of yogurt instead of individual-sized containers.  Choose water instead of sodas and other sweetened beverages.  Avoid buying chips, cookies, and  other "junk food." These items are usually expensive and not healthy. Cooking  Make extra food and freeze the extras in meal-sized containers or in individual portions for fast meals and snacks.  Pre-cook on days when you have extra time to prepare meals in advance. You can keep these meals in the fridge or freezer and reheat for a quick meal.  When you come home from the grocery store, wash, peel, and cut fruits and vegetables so they are ready to use and eat. This will help reduce food waste. Meal planning  Do not eat out or get fast food. Prepare food at home.  Make a grocery list and make sure to bring it with you to the store. If you have a smart phone, you could use your phone to create your shopping list.  Plan meals and snacks according to a grocery list and budget you create.  Use leftovers in your meal plan for the week.  Look for recipes where you can cook once and make enough food for two meals.  Include budget-friendly meals like stews, casseroles, and stir-fry dishes.  Try some meatless meals or try "no cook" meals like salads.  Make sure that half your plate is filled with fruits or vegetables. Choose from fresh, frozen, or canned fruits and vegetables. If eating canned, remember to rinse them before eating. This will remove any excess salt added for packaging. Summary  Eating healthy on a budget is possible if you plan your meals according to your budget, purchase according to your budget and grocery list, and prepare food yourself.  Tips for buying more food on a limited budget include buying generic brands, using coupons only for foods you normally buy, and  buying healthy items from the bulk bins when available.  Tips for buying cheaper food to replace expensive food include choosing cheaper, lean cuts of meat, and buying dried beans and peas. This information is not intended to replace advice given to you by your health care provider. Make sure you discuss any  questions you have with your health care provider. Document Released: 07/02/2014 Document Revised: 10/30/2017 Document Reviewed: 10/30/2017 Elsevier Patient Education  2020 [place] Following a healthy eating pattern may help you to achieve and maintain a healthy body weight, reduce the risk of chronic disease, and live a long and productive life. It is important to follow a healthy eating pattern at an appropriate calorie level for your body. Your nutritional needs should be met primarily through food by choosing a variety of nutrient-rich foods. What are tips for following this plan? Reading food labels  Read labels and choose the following: ? Reduced or low sodium. ? Juices with 100% fruit juice. ? Foods with low saturated fats and high polyunsaturated and monounsaturated fats. ? Foods with whole grains, such as whole wheat, cracked wheat, brown rice, and wild rice. ? Whole grains that are fortified with folic acid. This is recommended for women who are pregnant or who want to become pregnant.  Read labels and avoid the following: ? Foods with a lot of added sugars. These include foods that contain brown sugar, corn sweetener, corn syrup, dextrose, fructose, glucose, high-fructose corn syrup, honey, invert sugar, lactose, malt syrup, maltose, molasses, raw sugar, sucrose, trehalose, or turbinado sugar.  Do not eat more than the following amounts of added sugar per day:  6 teaspoons (25 g) for women.  9 teaspoons (38 g) for men. ? Foods that contain processed or refined starches and grains. ? Refined grain products, such as white flour, degermed cornmeal, white bread, and white rice. Shopping  Choose nutrient-rich snacks, such as vegetables, whole fruits, and nuts. Avoid high-calorie and high-sugar snacks, such as potato chips, fruit snacks, and candy.  Use oil-based dressings and spreads on foods instead of solid fats such as butter, stick margarine, or cream  cheese.  Limit pre-made sauces, mixes, and "instant" products such as flavored rice, instant noodles, and ready-made pasta.  Try more plant-protein sources, such as tofu, tempeh, black beans, edamame, lentils, nuts, and seeds.  Explore eating plans such as the Mediterranean diet or vegetarian diet. Cooking  Use oil to saut or stir-fry foods instead of solid fats such as butter, stick margarine, or lard.  Try baking, boiling, grilling, or broiling instead of frying.  Remove the fatty part of meats before cooking.  Steam vegetables in water or broth. Meal planning   At meals, imagine dividing your plate into fourths: ? One-half of your plate is fruits and vegetables. ? One-fourth of your plate is whole grains. ? One-fourth of your plate is protein, especially lean meats, poultry, eggs, tofu, beans, or nuts.  Include low-fat dairy as part of your daily diet. Lifestyle  Choose healthy options in all settings, including home, work, school, restaurants, or stores.  Prepare your food safely: ? Wash your hands after handling raw meats. ? Keep food preparation surfaces clean by regularly washing with hot, soapy water. ? Keep raw meats separate from ready-to-eat foods, such as fruits and vegetables. ? Cook seafood, meat, poultry, and eggs to the recommended internal temperature. ? Store foods at safe temperatures. In general:  Keep cold foods at 64F (4.4C) or below.  Keep hot  foods at 140F (60C) or above.  Keep your freezer at [facility] (-17.8C) or below.  Foods are no longer safe to eat when they have been between the temperatures of 40-140F (4.4-60C) for more than 2 hours. What foods should I eat? Fruits Aim to eat 2 cup-equivalents of fresh, canned (in natural juice), or frozen fruits each day. Examples of 1 cup-equivalent of fruit include 1 small apple, 8 large strawberries, 1 cup canned fruit,  cup dried fruit, or 1 cup 100% juice. Vegetables Aim to eat 2-3  cup-equivalents of fresh and frozen vegetables each day, including different varieties and colors. Examples of 1 cup-equivalent of vegetables include 2 medium carrots, 2 cups raw, leafy greens, 1 cup chopped vegetable (raw or cooked), or 1 medium baked potato. Grains Aim to eat 6 ounce-equivalents of whole grains each day. Examples of 1 ounce-equivalent of grains include 1 slice of bread, 1 cup ready-to-eat cereal, 3 cups popcorn, or  cup cooked rice, pasta, or cereal. Meats and other proteins Aim to eat 5-6 ounce-equivalents of protein each day. Examples of 1 ounce-equivalent of protein include 1 egg, 1/2 cup nuts or seeds, or 1 tablespoon (16 g) peanut butter. A cut of meat or fish that is the size of a deck of cards is about 3-4 ounce-equivalents.  Of the protein you eat each week, try to have at least 8 ounces come from seafood. This includes salmon, trout, herring, and anchovies. Dairy Aim to eat 3 cup-equivalents of fat-free or low-fat dairy each day. Examples of 1 cup-equivalent of dairy include 1 cup (240 mL) milk, 8 ounces (250 g) yogurt, 1 ounces (44 g) natural cheese, or 1 cup (240 mL) fortified soy milk. Fats and oils  Aim for about 5 teaspoons (21 g) per day. Choose monounsaturated fats, such as canola and olive oils, avocados, peanut butter, and most nuts, or polyunsaturated fats, such as sunflower, corn, and soybean oils, walnuts, pine nuts, sesame seeds, sunflower seeds, and flaxseed. Beverages  Aim for six 8-oz glasses of water per day. Limit coffee to three to five 8-oz cups per day.  Limit caffeinated beverages that have added calories, such as soda and energy drinks.  Limit alcohol intake to no more than 1 drink a day for nonpregnant women and 2 drinks a day for men. One drink equals 12 oz of beer (355 mL), 5 oz of wine (148 mL), or 1 oz of hard liquor (44 mL). Seasoning and other foods  Avoid adding excess amounts of salt to your foods. Try flavoring foods with herbs and  spices instead of salt.  Avoid adding sugar to foods.  Try using oil-based dressings, sauces, and spreads instead of solid fats. This information is based on general [place] nutrition guidelines. For more information, visit [employer]. Exact amounts may vary based on your nutrition needs. Summary  A healthy eating plan may help you to maintain a healthy weight, reduce the risk of chronic diseases, and stay active throughout your life.  Plan your meals. Make sure you eat the right portions of a variety of nutrient-rich foods.  Try baking, boiling, grilling, or broiling instead of frying.  Choose healthy options in all settings, including home, work, school, restaurants, or stores. This information is not intended to replace advice given to you by your health care provider. Make sure you discuss any questions you have with your health care provider. Document Released: 02/10/2018 Document Revised: 02/10/2018 Document Reviewed: 02/10/2018 Elsevier Patient Education  [place].  High Cholesterol  High cholesterol is a condition in which the blood has high levels of a white, waxy, fat-like substance (cholesterol). The human body needs small amounts of cholesterol. The liver makes all the cholesterol that the body needs. Extra (excess) cholesterol comes from the food that we eat. Cholesterol is carried from the liver by the blood through the blood vessels. If you have high cholesterol, deposits (plaques) may build up on the walls of your blood vessels (arteries). Plaques make the arteries narrower and stiffer. Cholesterol plaques increase your risk for heart attack and stroke. Work with your health care provider to keep your cholesterol levels in a healthy range. What increases the risk? This condition is more likely to develop in people who:  Eat foods that are high in animal fat (saturated fat) or cholesterol.  Are overweight.  Are not getting enough exercise.  Have a family  history of high cholesterol. What are the signs or symptoms? There are no symptoms of this condition. How is this diagnosed? This condition may be diagnosed from the results of a blood test.  If you are older than age 52, your health care provider may check your cholesterol every 4-6 years.  You may be checked more often if you already have high cholesterol or other risk factors for heart disease. The blood test for cholesterol measures:  "Bad" cholesterol (LDL cholesterol). This is the main type of cholesterol that causes heart disease. The desired level for LDL is less than 100.  "Good" cholesterol (HDL cholesterol). This type helps to protect against heart disease by cleaning the arteries and carrying the LDL away. The desired level for HDL is 60 or higher.  Triglycerides. These are fats that the body can store or burn for energy. The desired number for triglycerides is lower than 150.  Total cholesterol. This is a measure of the total amount of cholesterol in your blood, including LDL cholesterol, HDL cholesterol, and triglycerides. A healthy number is less than 200. How is this treated? This condition is treated with diet changes, lifestyle changes, and medicines. Diet changes  This may include eating more whole grains, fruits, vegetables, nuts, and fish.  This may also include cutting back on red meat and foods that have a lot of added sugar. Lifestyle changes  Changes may include getting at least 40 minutes of aerobic exercise 3 times a week. Aerobic exercises include walking, biking, and swimming. Aerobic exercise along with a healthy diet can help you maintain a healthy weight.  Changes may also include quitting smoking. Medicines  Medicines are usually given if diet and lifestyle changes have failed to reduce your cholesterol to healthy levels.  Your health care provider may prescribe a statin medicine. Statin medicines have been shown to reduce cholesterol, which can  reduce the risk of heart disease. Follow these instructions at home: Eating and drinking If told by your health care provider:  Eat chicken (without skin), fish, veal, shellfish, ground [place] breast, and round or loin cuts of red meat.  Do not eat fried foods or fatty meats, such as hot dogs and salami.  Eat plenty of fruits, such as apples.  Eat plenty of vegetables, such as broccoli, potatoes, and carrots.  Eat beans, peas, and lentils.  Eat grains such as barley, rice, couscous, and bulgur wheat.  Eat pasta without cream sauces.  Use skim or nonfat milk, and eat low-fat or nonfat yogurt and cheeses.  Do not eat or drink whole milk, cream, ice cream, egg yolks, or hard  cheeses.  Do not eat stick margarine or tub margarines that contain trans fats (also called partially hydrogenated oils).  Do not eat saturated tropical oils, such as coconut oil and palm oil.  Do not eat cakes, cookies, crackers, or other baked goods that contain trans fats.  General instructions  Exercise as directed by your health care provider. Increase your activity level with activities such as gardening, walking, and taking the stairs.  Take over-the-counter and prescription medicines only as told by your health care provider.  Do not use any products that contain nicotine or tobacco, such as cigarettes and e-cigarettes. If you need help quitting, ask your health care provider.  Keep all follow-up visits as told by your health care provider. This is important. Contact a health care provider if:  You are struggling to maintain a healthy diet or weight.  You need help to start on an exercise program.  You need help to stop smoking. Get help right away if:  You have chest pain.  You have trouble breathing. This information is not intended to replace advice given to you by your health care provider. Make sure you discuss any questions you have with your health care provider. Document Released:  10/29/2005 Document Revised: 11/01/2017 Document Reviewed: 04/28/2016 Elsevier Patient Education  [place].  High Cholesterol  High cholesterol is a condition in which the blood has high levels of a white, waxy, fat-like substance (cholesterol). The human body needs small amounts of cholesterol. The liver makes all the cholesterol that the body needs. Extra (excess) cholesterol comes from the food that we eat. Cholesterol is carried from the liver by the blood through the blood vessels. If you have high cholesterol, deposits (plaques) may build up on the walls of your blood vessels (arteries). Plaques make the arteries narrower and stiffer. Cholesterol plaques increase your risk for heart attack and stroke. Work with your health care provider to keep your cholesterol levels in a healthy range. What increases the risk? This condition is more likely to develop in people who:  Eat foods that are high in animal fat (saturated fat) or cholesterol.  Are overweight.  Are not getting enough exercise.  Have a family history of high cholesterol. What are the signs or symptoms? There are no symptoms of this condition. How is this diagnosed? This condition may be diagnosed from the results of a blood test.  If you are older than age 25, your health care provider may check your cholesterol every 4-6 years.  You may be checked more often if you already have high cholesterol or other risk factors for heart disease. The blood test for cholesterol measures:  "Bad" cholesterol (LDL cholesterol). This is the main type of cholesterol that causes heart disease. The desired level for LDL is less than 100.  "Good" cholesterol (HDL cholesterol). This type helps to protect against heart disease by cleaning the arteries and carrying the LDL away. The desired level for HDL is 60 or higher.  Triglycerides. These are fats that the body can store or burn for energy. The desired number for triglycerides is  lower than 150.  Total cholesterol. This is a measure of the total amount of cholesterol in your blood, including LDL cholesterol, HDL cholesterol, and triglycerides. A healthy number is less than 200. How is this treated? This condition is treated with diet changes, lifestyle changes, and medicines. Diet changes  This may include eating more whole grains, fruits, vegetables, nuts, and fish.  This may also  include cutting back on red meat and foods that have a lot of added sugar. Lifestyle changes  Changes may include getting at least 40 minutes of aerobic exercise 3 times a week. Aerobic exercises include walking, biking, and swimming. Aerobic exercise along with a healthy diet can help you maintain a healthy weight.  Changes may also include quitting smoking. Medicines  Medicines are usually given if diet and lifestyle changes have failed to reduce your cholesterol to healthy levels.  Your health care provider may prescribe a statin medicine. Statin medicines have been shown to reduce cholesterol, which can reduce the risk of heart disease. Follow these instructions at home: Eating and drinking If told by your health care provider:  Eat chicken (without skin), fish, veal, shellfish, ground [place] breast, and round or loin cuts of red meat.  Do not eat fried foods or fatty meats, such as hot dogs and salami.  Eat plenty of fruits, such as apples.  Eat plenty of vegetables, such as broccoli, potatoes, and carrots.  Eat beans, peas, and lentils.  Eat grains such as barley, rice, couscous, and bulgur wheat.  Eat pasta without cream sauces.  Use skim or nonfat milk, and eat low-fat or nonfat yogurt and cheeses.  Do not eat or drink whole milk, cream, ice cream, egg yolks, or hard cheeses.  Do not eat stick margarine or tub margarines that contain trans fats (also called partially hydrogenated oils).  Do not eat saturated tropical oils, such as coconut oil and palm oil.  Do  not eat cakes, cookies, crackers, or other baked goods that contain trans fats.  General instructions  Exercise as directed by your health care provider. Increase your activity level with activities such as gardening, walking, and taking the stairs.  Take over-the-counter and prescription medicines only as told by your health care provider.  Do not use any products that contain nicotine or tobacco, such as cigarettes and e-cigarettes. If you need help quitting, ask your health care provider.  Keep all follow-up visits as told by your health care provider. This is important. Contact a health care provider if:  You are struggling to maintain a healthy diet or weight.  You need help to start on an exercise program.  You need help to stop smoking. Get help right away if:  You have chest pain.  You have trouble breathing. This information is not intended to replace advice given to you by your health care provider. Make sure you discuss any questions you have with your health care provider. Document Released: 10/29/2005 Document Revised: 11/01/2017 Document Reviewed: 04/28/2016 Elsevier Patient Education  2020 [employer].

## 2019-02-04 ENCOUNTER — Encounter: Payer: Self-pay | Admitting: Internal Medicine

## 2019-02-04 DIAGNOSIS — E559 Vitamin D deficiency, unspecified: Secondary | ICD-10-CM | POA: Insufficient documentation

## 2019-02-04 DIAGNOSIS — R067 Sneezing: Secondary | ICD-10-CM

## 2019-02-04 DIAGNOSIS — E1169 Type 2 diabetes mellitus with other specified complication: Secondary | ICD-10-CM | POA: Insufficient documentation

## 2019-02-04 DIAGNOSIS — I152 Hypertension secondary to endocrine disorders: Secondary | ICD-10-CM | POA: Insufficient documentation

## 2019-02-04 DIAGNOSIS — E785 Hyperlipidemia, unspecified: Secondary | ICD-10-CM | POA: Insufficient documentation

## 2019-02-04 DIAGNOSIS — Z9989 Dependence on other enabling machines and devices: Secondary | ICD-10-CM

## 2019-02-04 DIAGNOSIS — M8580 Other specified disorders of bone density and structure, unspecified site: Secondary | ICD-10-CM | POA: Insufficient documentation

## 2019-02-04 DIAGNOSIS — R7303 Prediabetes: Secondary | ICD-10-CM

## 2019-02-04 DIAGNOSIS — I10 Essential (primary) hypertension: Secondary | ICD-10-CM | POA: Insufficient documentation

## 2019-02-04 DIAGNOSIS — G4733 Obstructive sleep apnea (adult) (pediatric): Secondary | ICD-10-CM | POA: Insufficient documentation

## 2019-02-04 DIAGNOSIS — E1159 Type 2 diabetes mellitus with other circulatory complications: Secondary | ICD-10-CM | POA: Insufficient documentation

## 2019-02-04 HISTORY — DX: Prediabetes: R73.03

## 2019-02-04 HISTORY — DX: Sneezing: R06.7

## 2019-02-04 LAB — URINALYSIS, ROUTINE W REFLEX MICROSCOPIC
Bilirubin, UA: NEGATIVE
Glucose, UA: NEGATIVE
Ketones, UA: NEGATIVE
Leukocytes, UA: NEGATIVE
Nitrite, UA: NEGATIVE
PH UA: 6.5 (ref 5.0–7.5)
Protein, UA: NEGATIVE
RBC, UA: NEGATIVE
Specific Gravity, UA: 1.008 (ref 1.005–1.030)
Urobilinogen, Ur: 0.2 mg/dL (ref 0.2–1.0)

## 2019-02-04 LAB — VITAMIN D 25 HYDROXY (VIT D DEFICIENCY, FRACTURES): VITD: 23.23 ng/mL — ABNORMAL LOW (ref 30.00–100.00)

## 2019-02-04 LAB — TSH: TSH: 1.4 u[IU]/mL (ref 0.35–4.50)

## 2019-02-04 NOTE — Progress Notes (Signed)
Chief Complaint  Patient presents with  . Establish Care   New patient  1. HTn/HLD uncontrolled on hctz 25 mg qd  2. C/o cough, sneezing today no body aches, no fever, sore throat, body aches 3. OSA on cpap not used in 2 months started using last night  4. H/o prediabetes A1c 6.3 11/10/18 had other labs 11/10/18 CMET, CBC nl lipid 182, TG 91, HDL 44.4, LDL 119 TSH 2.425 12/06/17, HCV neg 09/30/17    Review of Systems  Constitutional: Negative for weight loss.  HENT: Negative for hearing loss.        Sneezing no h/o allergies   Eyes: Negative for blurred vision.  Respiratory: Negative for shortness of breath.   Cardiovascular: Negative for chest pain.  Gastrointestinal: Negative for abdominal pain.  Musculoskeletal: Negative for falls.  Skin: Negative for rash.  Neurological: Negative for headaches.  Endo/Heme/Allergies: Negative for environmental allergies.  Psychiatric/Behavioral: Negative for depression.   Past Medical History:  Diagnosis Date  . Diabetes mellitus without complication ([place])   . Hypertension   . Sleep apnea    Past Surgical History:  Procedure Laterality Date  . ABDOMINAL HYSTERECTOMY     Family History  Problem Relation Age of Onset  . Diabetes Father   . Breast cancer Neg Hx    Social History   Socioeconomic History  . Marital status: Legally Separated    Spouse name: Not on file  . Number of children: Not on file  . Years of education: Not on file  . Highest education level: Not on file  Occupational History  . Not on file  Social Needs  . Financial resource strain: Not on file  . Food insecurity:    Worry: Not on file    Inability: Not on file  . Transportation needs:    Medical: Not on file    Non-medical: Not on file  Tobacco Use  . Smoking status: Never Smoker  . Smokeless tobacco: Never Used  Substance and Sexual Activity  . Alcohol use: Yes    Alcohol/week: 0.0 standard drinks    Comment: 1-2 per month  . Drug use: Not on  file  . Sexual activity: Not on file  Lifestyle  . Physical activity:    Days per week: Not on file    Minutes per session: Not on file  . Stress: Not on file  Relationships  . Social connections:    Talks on phone: Not on file    Gets together: Not on file    Attends religious service: Not on file    Active member of club or organization: Not on file    Attends meetings of clubs or organizations: Not on file    Relationship status: Not on file  . Intimate partner violence:    Fear of current or ex partner: Not on file    Emotionally abused: Not on file    Physically abused: Not on file    Forced sexual activity: Not on file  Other Topics Concern  . Not on file  Social History Narrative  . Not on file   Current Meds  Medication Sig  . [DISCONTINUED] hydrochlorothiazide (HYDRODIURIL) 25 MG tablet Take 25 mg by mouth daily.   No Known Allergies Recent Results (from the past 2160 hour(s))  Urinalysis, Routine w reflex microscopic     Status: None   Collection Time: 02/03/19  3:03 PM  Result Value Ref Range   Specific Gravity, UA 1.008 1.005 - 1.030  pH, UA 6.5 5.0 - 7.5   Color, UA Yellow Yellow   Appearance Ur Clear Clear   Leukocytes, UA Negative Negative   Protein, UA Negative Negative/Trace   Glucose, UA Negative Negative   Ketones, UA Negative Negative   RBC, UA Negative Negative   Bilirubin, UA Negative Negative   Urobilinogen, Ur 0.2 0.2 - 1.0 mg/dL   Nitrite, UA Negative Negative   Microscopic Examination Comment     Comment: Microscopic not indicated and not performed.   Objective  Body mass index is 29.01 kg/m. Wt Readings from Last 3 Encounters:  02/03/19 185 lb 3.2 oz (84 kg)  09/01/18 185 lb 1.6 oz (84 kg)  08/25/18 184 lb (83.5 kg)   Temp Readings from Last 3 Encounters:  02/03/19 97.9 F (36.6 C) (Oral)   BP Readings from Last 3 Encounters:  02/03/19 (!) 150/80  09/01/18 130/78  06/20/18 134/78   Pulse Readings from Last 3 Encounters:   02/03/19 70    Physical Exam Vitals signs and nursing note reviewed.  Constitutional:      Appearance: Normal appearance. She is well-developed and well-groomed.  HENT:     Head: Normocephalic and atraumatic.     Nose: Nose normal.     Mouth/Throat:     Mouth: Mucous membranes are moist.     Pharynx: Oropharynx is clear.  Eyes:     Conjunctiva/sclera: Conjunctivae normal.     Pupils: Pupils are equal, round, and reactive to light.  Cardiovascular:     Rate and Rhythm: Normal rate and regular rhythm.     Heart sounds: Normal heart sounds. No murmur.  Pulmonary:     Effort: Pulmonary effort is normal.     Breath sounds: Normal breath sounds.  Skin:    General: Skin is warm and dry.  Neurological:     General: No focal deficit present.     Mental Status: She is alert and oriented to person, place, and time. Mental status is at baseline.     Gait: Gait normal.  Psychiatric:        Attention and Perception: Attention and perception normal.        Mood and Affect: Mood and affect normal.        Speech: Speech normal.        Behavior: Behavior normal. Behavior is cooperative.        Thought Content: Thought content normal.        Cognition and Memory: Cognition and memory normal.        Judgment: Judgment normal.     Assessment   1. HTN/HLD  2. Sneezing likely URI  3. OSA on cpap 4. Prediabetes 5. HM 6. H/o UTI  Plan   1. Change hctz 25 to benicar hctz 20/12.5  2. Prn allergy pill otc  3. Cont cpap sleep study 04/01/16  4. Disc healthy diet choices and cinnamon capsules  5.  Declines flu shot  Tdap 10/03/15  Consider shingrix  Consider hep B,MMR in future   Check vitamin D and TSH today  mammo 10/28/18 negative  EGD 08/09/10 mild gastritis  Colonoscopy Dr. Tiffany Kocher 2015 get report 10/05/14  S/p hysterectomy no h/o abnl had in 2012  DEXA 12/13/16 osteopenia rec calcium and vitamin  HCV 09/30/17 negative   6. Repeat UA and culture   [place] [facility]  Dentist Dr.  Lynelle Smoke  Provider: Dr. Olivia Mackie McLean-Scocuzza-[facility]

## 2019-02-05 LAB — URINE CULTURE

## 2019-02-09 ENCOUNTER — Encounter: Payer: Self-pay | Admitting: Internal Medicine

## 2019-02-10 ENCOUNTER — Encounter: Payer: Self-pay | Admitting: Internal Medicine

## 2019-02-23 ENCOUNTER — Encounter: Payer: Self-pay | Admitting: Internal Medicine

## 2019-02-25 ENCOUNTER — Other Ambulatory Visit: Payer: Self-pay | Admitting: Internal Medicine

## 2019-02-25 ENCOUNTER — Encounter: Payer: Self-pay | Admitting: Internal Medicine

## 2019-02-25 DIAGNOSIS — I10 Essential (primary) hypertension: Secondary | ICD-10-CM

## 2019-02-25 MED ORDER — AMLODIPINE BESYLATE 2.5 MG PO TABS: 2.5000 mg | ORAL_TABLET | Freq: Every day | ORAL | 1 refills | Status: DC

## 2019-02-25 MED ORDER — HYDROCHLOROTHIAZIDE 25 MG PO TABS: 25.0000 mg | ORAL_TABLET | Freq: Every day | ORAL | 3 refills | Status: DC

## 2019-03-20 ENCOUNTER — Encounter: Payer: Self-pay | Admitting: Internal Medicine

## 2019-03-23 ENCOUNTER — Other Ambulatory Visit: Payer: Self-pay | Admitting: Internal Medicine

## 2019-03-23 DIAGNOSIS — I10 Essential (primary) hypertension: Secondary | ICD-10-CM

## 2019-03-23 DIAGNOSIS — R7303 Prediabetes: Secondary | ICD-10-CM

## 2019-03-23 MED ORDER — AMLODIPINE BESYLATE 5 MG PO TABS: 5.0000 mg | ORAL_TABLET | Freq: Every day | ORAL | 3 refills | Status: DC

## 2019-03-31 ENCOUNTER — Encounter: Payer: Self-pay | Admitting: *Deleted

## 2019-06-02 ENCOUNTER — Other Ambulatory Visit (INDEPENDENT_AMBULATORY_CARE_PROVIDER_SITE_OTHER): Payer: BC Managed Care – PPO

## 2019-06-02 ENCOUNTER — Other Ambulatory Visit: Payer: Self-pay

## 2019-06-02 DIAGNOSIS — I10 Essential (primary) hypertension: Secondary | ICD-10-CM

## 2019-06-02 DIAGNOSIS — R7303 Prediabetes: Secondary | ICD-10-CM | POA: Diagnosis not present

## 2019-06-02 LAB — HEMOGLOBIN A1C: Hgb A1c MFr Bld: 6.3 % (ref 4.6–6.5)

## 2019-06-02 LAB — CBC WITH DIFFERENTIAL/PLATELET
Basophils Absolute: 0 10*3/uL (ref 0.0–0.1)
Basophils Relative: 0.6 % (ref 0.0–3.0)
Eosinophils Absolute: 0.1 10*3/uL (ref 0.0–0.7)
Eosinophils Relative: 1.8 % (ref 0.0–5.0)
HCT: 40.8 % (ref 36.0–46.0)
Hemoglobin: 13.5 g/dL (ref 12.0–15.0)
Lymphocytes Relative: 25.8 % (ref 12.0–46.0)
Lymphs Abs: 2 10*3/uL (ref 0.7–4.0)
MCHC: 33.2 g/dL (ref 30.0–36.0)
MCV: 89.1 fl (ref 78.0–100.0)
Monocytes Absolute: 0.6 10*3/uL (ref 0.1–1.0)
Monocytes Relative: 7.8 % (ref 3.0–12.0)
Neutro Abs: 4.9 10*3/uL (ref 1.4–7.7)
Neutrophils Relative %: 64 % (ref 43.0–77.0)
Platelets: 258 10*3/uL (ref 150.0–400.0)
RBC: 4.58 Mil/uL (ref 3.87–5.11)
RDW: 13.2 % (ref 11.5–15.5)
WBC: 7.7 10*3/uL (ref 4.0–10.5)

## 2019-06-02 LAB — COMPREHENSIVE METABOLIC PANEL
ALT: 14 U/L (ref 0–35)
AST: 14 U/L (ref 0–37)
Albumin: 4.6 g/dL (ref 3.5–5.2)
Alkaline Phosphatase: 68 U/L (ref 39–117)
BUN: 18 mg/dL (ref 6–23)
CO2: 30 mEq/L (ref 19–32)
Calcium: 9.6 mg/dL (ref 8.4–10.5)
Chloride: 103 mEq/L (ref 96–112)
Creatinine, Ser: 0.94 mg/dL (ref 0.40–1.20)
GFR: 73.42 mL/min (ref >60.00)
Glucose, Bld: 115 mg/dL — ABNORMAL HIGH (ref 70–99)
Potassium: 3.7 mEq/L (ref 3.5–5.1)
Sodium: 140 mEq/L (ref 135–145)
Total Bilirubin: 0.5 mg/dL (ref 0.2–1.2)
Total Protein: 7 g/dL (ref 6.0–8.3)

## 2019-06-02 LAB — LIPID PANEL
Cholesterol: 179 mg/dL (ref 0–200)
HDL: 48.9 mg/dL (ref >39.00)
LDL Cholesterol: 112 mg/dL — ABNORMAL HIGH (ref 0–99)
NonHDL: 129.7
Total CHOL/HDL Ratio: 4
Triglycerides: 88 mg/dL (ref 0.0–149.0)
VLDL: 17.6 mg/dL (ref 0.0–40.0)

## 2019-06-03 ENCOUNTER — Encounter: Payer: Self-pay | Admitting: Internal Medicine

## 2019-09-08 ENCOUNTER — Telehealth: Payer: Self-pay | Admitting: *Deleted

## 2019-09-08 NOTE — Telephone Encounter (Signed)
Copied from [place] [id number]. Topic: General - Inquiry >> Sep 08, 2019  3:39 PM Mathis Bud wrote: Reason for CRM: Patient would like to get her A1C checked.  No lab orders.  Requesting to get checked.  Call back [phone]

## 2019-09-09 ENCOUNTER — Other Ambulatory Visit: Payer: Self-pay | Admitting: Internal Medicine

## 2019-09-09 DIAGNOSIS — E559 Vitamin D deficiency, unspecified: Secondary | ICD-10-CM

## 2019-09-09 DIAGNOSIS — R7303 Prediabetes: Secondary | ICD-10-CM

## 2019-09-09 DIAGNOSIS — I10 Essential (primary) hypertension: Secondary | ICD-10-CM

## 2019-09-09 DIAGNOSIS — E785 Hyperlipidemia, unspecified: Secondary | ICD-10-CM

## 2019-09-09 NOTE — Telephone Encounter (Signed)
Lab orders in please sch fasting labs

## 2019-09-14 ENCOUNTER — Other Ambulatory Visit: Payer: Self-pay | Admitting: Internal Medicine

## 2019-09-14 DIAGNOSIS — Z1231 Encounter for screening mammogram for malignant neoplasm of breast: Secondary | ICD-10-CM

## 2019-09-18 ENCOUNTER — Other Ambulatory Visit (INDEPENDENT_AMBULATORY_CARE_PROVIDER_SITE_OTHER): Payer: BC Managed Care – PPO

## 2019-09-18 ENCOUNTER — Other Ambulatory Visit: Payer: Self-pay

## 2019-09-18 DIAGNOSIS — R7303 Prediabetes: Secondary | ICD-10-CM | POA: Diagnosis not present

## 2019-09-18 DIAGNOSIS — E559 Vitamin D deficiency, unspecified: Secondary | ICD-10-CM | POA: Diagnosis not present

## 2019-09-18 DIAGNOSIS — E785 Hyperlipidemia, unspecified: Secondary | ICD-10-CM | POA: Diagnosis not present

## 2019-09-18 DIAGNOSIS — I10 Essential (primary) hypertension: Secondary | ICD-10-CM

## 2019-09-18 LAB — HEMOGLOBIN A1C: Hgb A1c MFr Bld: 6.3 % (ref 4.6–6.5)

## 2019-09-18 LAB — COMPREHENSIVE METABOLIC PANEL
ALT: 14 U/L (ref 0–35)
AST: 15 U/L (ref 0–37)
Albumin: 4.7 g/dL (ref 3.5–5.2)
Alkaline Phosphatase: 71 U/L (ref 39–117)
BUN: 19 mg/dL (ref 6–23)
CO2: 31 mEq/L (ref 19–32)
Calcium: 9.8 mg/dL (ref 8.4–10.5)
Chloride: 102 mEq/L (ref 96–112)
Creatinine, Ser: 0.91 mg/dL (ref 0.40–1.20)
GFR: 76.14 mL/min (ref >60.00)
Glucose, Bld: 100 mg/dL — ABNORMAL HIGH (ref 70–99)
Potassium: 4.1 mEq/L (ref 3.5–5.1)
Sodium: 142 mEq/L (ref 135–145)
Total Bilirubin: 0.5 mg/dL (ref 0.2–1.2)
Total Protein: 7.2 g/dL (ref 6.0–8.3)

## 2019-09-18 LAB — VITAMIN D 25 HYDROXY (VIT D DEFICIENCY, FRACTURES): VITD: 51.42 ng/mL (ref 30.00–100.00)

## 2019-09-18 LAB — LIPID PANEL
Cholesterol: 193 mg/dL (ref 0–200)
HDL: 48.6 mg/dL (ref >39.00)
LDL Cholesterol: 128 mg/dL — ABNORMAL HIGH (ref 0–99)
NonHDL: 143.95
Total CHOL/HDL Ratio: 4
Triglycerides: 79 mg/dL (ref 0.0–149.0)
VLDL: 15.8 mg/dL (ref 0.0–40.0)

## 2019-10-02 DIAGNOSIS — G4733 Obstructive sleep apnea (adult) (pediatric): Secondary | ICD-10-CM | POA: Diagnosis not present

## 2019-10-14 ENCOUNTER — Encounter: Payer: Self-pay | Admitting: Internal Medicine

## 2019-10-22 LAB — HM DIABETES EYE EXAM

## 2019-10-27 ENCOUNTER — Encounter: Payer: Self-pay | Admitting: Internal Medicine

## 2019-10-30 ENCOUNTER — Ambulatory Visit
Admission: RE | Admit: 2019-10-30 | Discharge: 2019-10-30 | Disposition: A | Discharge status: Home / Self Care | Payer: BC Managed Care – PPO | Source: Ambulatory Visit | Attending: Internal Medicine | Admitting: Internal Medicine

## 2019-10-30 DIAGNOSIS — Z1231 Encounter for screening mammogram for malignant neoplasm of breast: Secondary | ICD-10-CM | POA: Diagnosis not present

## 2020-03-08 ENCOUNTER — Encounter: Payer: Self-pay | Admitting: Internal Medicine

## 2020-03-09 ENCOUNTER — Other Ambulatory Visit: Payer: Self-pay | Admitting: Internal Medicine

## 2020-03-09 DIAGNOSIS — I10 Essential (primary) hypertension: Secondary | ICD-10-CM

## 2020-03-09 DIAGNOSIS — R7303 Prediabetes: Secondary | ICD-10-CM

## 2020-03-09 DIAGNOSIS — Z1389 Encounter for screening for other disorder: Secondary | ICD-10-CM

## 2020-03-09 DIAGNOSIS — Z1329 Encounter for screening for other suspected endocrine disorder: Secondary | ICD-10-CM

## 2020-03-09 DIAGNOSIS — E785 Hyperlipidemia, unspecified: Secondary | ICD-10-CM

## 2020-03-10 ENCOUNTER — Other Ambulatory Visit (INDEPENDENT_AMBULATORY_CARE_PROVIDER_SITE_OTHER): Payer: BC Managed Care – PPO

## 2020-03-10 ENCOUNTER — Other Ambulatory Visit: Payer: Self-pay

## 2020-03-10 DIAGNOSIS — R7303 Prediabetes: Secondary | ICD-10-CM

## 2020-03-10 DIAGNOSIS — I10 Essential (primary) hypertension: Secondary | ICD-10-CM

## 2020-03-10 DIAGNOSIS — Z1389 Encounter for screening for other disorder: Secondary | ICD-10-CM

## 2020-03-10 DIAGNOSIS — Z1329 Encounter for screening for other suspected endocrine disorder: Secondary | ICD-10-CM | POA: Diagnosis not present

## 2020-03-10 DIAGNOSIS — E785 Hyperlipidemia, unspecified: Secondary | ICD-10-CM

## 2020-03-10 LAB — CBC WITH DIFFERENTIAL/PLATELET
Basophils Absolute: 0 10*3/uL (ref 0.0–0.1)
Basophils Relative: 0.6 % (ref 0.0–3.0)
Eosinophils Absolute: 0.1 10*3/uL (ref 0.0–0.7)
Eosinophils Relative: 2 % (ref 0.0–5.0)
HCT: 39.6 % (ref 36.0–46.0)
Hemoglobin: 13.2 g/dL (ref 12.0–15.0)
Lymphocytes Relative: 27.8 % (ref 12.0–46.0)
Lymphs Abs: 2 10*3/uL (ref 0.7–4.0)
MCHC: 33.2 g/dL (ref 30.0–36.0)
MCV: 89.6 fl (ref 78.0–100.0)
Monocytes Absolute: 0.6 10*3/uL (ref 0.1–1.0)
Monocytes Relative: 8.9 % (ref 3.0–12.0)
Neutro Abs: 4.3 10*3/uL (ref 1.4–7.7)
Neutrophils Relative %: 60.7 % (ref 43.0–77.0)
Platelets: 242 10*3/uL (ref 150.0–400.0)
RBC: 4.42 Mil/uL (ref 3.87–5.11)
RDW: 13 % (ref 11.5–15.5)
WBC: 7.1 10*3/uL (ref 4.0–10.5)

## 2020-03-10 LAB — LIPID PANEL
Cholesterol: 172 mg/dL (ref 0–200)
HDL: 46.6 mg/dL (ref >39.00)
LDL Cholesterol: 109 mg/dL — ABNORMAL HIGH (ref 0–99)
NonHDL: 125.33
Total CHOL/HDL Ratio: 4
Triglycerides: 80 mg/dL (ref 0.0–149.0)
VLDL: 16 mg/dL (ref 0.0–40.0)

## 2020-03-10 LAB — HEMOGLOBIN A1C: Hgb A1c MFr Bld: 6.3 % (ref 4.6–6.5)

## 2020-03-10 LAB — COMPREHENSIVE METABOLIC PANEL
ALT: 15 U/L (ref 0–35)
AST: 16 U/L (ref 0–37)
Albumin: 4.5 g/dL (ref 3.5–5.2)
Alkaline Phosphatase: 64 U/L (ref 39–117)
BUN: 19 mg/dL (ref 6–23)
CO2: 32 mEq/L (ref 19–32)
Calcium: 9.8 mg/dL (ref 8.4–10.5)
Chloride: 102 mEq/L (ref 96–112)
Creatinine, Ser: 0.91 mg/dL (ref 0.40–1.20)
GFR: 76.02 mL/min (ref >60.00)
Glucose, Bld: 106 mg/dL — ABNORMAL HIGH (ref 70–99)
Potassium: 3.9 mEq/L (ref 3.5–5.1)
Sodium: 139 mEq/L (ref 135–145)
Total Bilirubin: 0.5 mg/dL (ref 0.2–1.2)
Total Protein: 7.3 g/dL (ref 6.0–8.3)

## 2020-03-10 LAB — TSH: TSH: 1.67 u[IU]/mL (ref 0.35–4.50)

## 2020-03-11 ENCOUNTER — Ambulatory Visit (INDEPENDENT_AMBULATORY_CARE_PROVIDER_SITE_OTHER): Payer: BC Managed Care – PPO | Admitting: Internal Medicine

## 2020-03-11 ENCOUNTER — Encounter: Payer: Self-pay | Admitting: Internal Medicine

## 2020-03-11 VITALS — BP 118/70 | HR 87 | Temp 97.0°F | Ht 67.0 in | Wt 190.0 lb

## 2020-03-11 DIAGNOSIS — F419 Anxiety disorder, unspecified: Secondary | ICD-10-CM

## 2020-03-11 DIAGNOSIS — Z0000 Encounter for general adult medical examination without abnormal findings: Secondary | ICD-10-CM

## 2020-03-11 DIAGNOSIS — Z780 Asymptomatic menopausal state: Secondary | ICD-10-CM

## 2020-03-11 DIAGNOSIS — E559 Vitamin D deficiency, unspecified: Secondary | ICD-10-CM

## 2020-03-11 DIAGNOSIS — I10 Essential (primary) hypertension: Secondary | ICD-10-CM

## 2020-03-11 DIAGNOSIS — Z113 Encounter for screening for infections with a predominantly sexual mode of transmission: Secondary | ICD-10-CM

## 2020-03-11 DIAGNOSIS — E785 Hyperlipidemia, unspecified: Secondary | ICD-10-CM

## 2020-03-11 DIAGNOSIS — Z1231 Encounter for screening mammogram for malignant neoplasm of breast: Secondary | ICD-10-CM | POA: Diagnosis not present

## 2020-03-11 DIAGNOSIS — F32A Anxiety disorder, unspecified: Secondary | ICD-10-CM | POA: Insufficient documentation

## 2020-03-11 DIAGNOSIS — F329 Major depressive disorder, single episode, unspecified: Secondary | ICD-10-CM

## 2020-03-11 DIAGNOSIS — Z1389 Encounter for screening for other disorder: Secondary | ICD-10-CM

## 2020-03-11 DIAGNOSIS — R7303 Prediabetes: Secondary | ICD-10-CM

## 2020-03-11 DIAGNOSIS — Z1329 Encounter for screening for other suspected endocrine disorder: Secondary | ICD-10-CM

## 2020-03-11 HISTORY — DX: Asymptomatic menopausal state: Z78.0

## 2020-03-11 HISTORY — DX: Anxiety disorder, unspecified: F32.A

## 2020-03-11 HISTORY — DX: Encounter for general adult medical examination without abnormal findings: Z00.00

## 2020-03-11 HISTORY — DX: Anxiety disorder, unspecified: F41.9

## 2020-03-11 LAB — URINALYSIS, ROUTINE W REFLEX MICROSCOPIC
Bilirubin Urine: NEGATIVE
Glucose, UA: NEGATIVE
Hgb urine dipstick: NEGATIVE
Ketones, ur: NEGATIVE
Leukocytes,Ua: NEGATIVE
Nitrite: NEGATIVE
Protein, ur: NEGATIVE
Specific Gravity, Urine: 1.007 (ref 1.001–1.03)
pH: 7.5 (ref 5.0–8.0)

## 2020-03-11 MED ORDER — VENLAFAXINE HCL ER 37.5 MG PO CP24: 37.5000 mg | ORAL_CAPSULE | Freq: Every day | ORAL | 3 refills | Status: DC

## 2020-03-11 MED ORDER — HYDROCHLOROTHIAZIDE 25 MG PO TABS: 25.0000 mg | ORAL_TABLET | Freq: Every day | ORAL | 3 refills | Status: DC

## 2020-03-11 MED ORDER — AMLODIPINE BESYLATE 5 MG PO TABS: 5.0000 mg | ORAL_TABLET | Freq: Every day | ORAL | 3 refills | Status: DC

## 2020-03-11 NOTE — Patient Instructions (Addendum)
noom app The next 56 days ~$140   Reach out to therapist  [facility]   Melatonin supplement 5-10 mg at night for sleep or L theaninine 100-200 mg at night   Venlafaxine tablets/Effexor What is this medicine? VENLAFAXINE (VEN la fax een) is used to treat depression, anxiety and panic disorder. This medicine may be used for other purposes; ask your health care provider or pharmacist if you have questions. COMMON BRAND NAME(S): Effexor What should I tell my health care provider before I take this medicine? They need to know if you have any of these conditions:  bleeding disorders  glaucoma  heart disease  high blood pressure  high cholesterol  kidney disease  liver disease  low levels of sodium in the blood  mania or bipolar disorder  seizures  suicidal thoughts, plans, or attempt; a previous suicide attempt by you or a family  take medicines that treat or prevent blood clots  thyroid disease  an unusual or allergic reaction to venlafaxine, desvenlafaxine, other medicines, foods, dyes, or preservatives  pregnant or trying to get pregnant  breast-feeding How should I use this medicine? Take this medicine by mouth with a glass of water. Follow the directions on the prescription label. Take it with food. Take your medicine at regular intervals. Do not take your medicine more often than directed. Do not stop taking this medicine suddenly except upon the advice of your doctor. Stopping this medicine too quickly may cause serious side effects or your condition may worsen. A special MedGuide will be given to you by the pharmacist with each prescription and refill. Be sure to read this information carefully each time. Talk to your pediatrician regarding the use of this medicine in children. Special care may be needed. Overdosage: If you think you have taken too much of this medicine contact a poison control center or emergency room at once. NOTE: This medicine is  only for you. Do not share this medicine with others. What if I miss a dose? If you miss a dose, take it as soon as you can. If it is almost time for your next dose, take only that dose. Do not take double or extra doses. What may interact with this medicine? Do not take this medicine with any of the following medications:  certain medicines for fungal infections like fluconazole, itraconazole, ketoconazole, posaconazole, voriconazole  cisapride  desvenlafaxine  dronedarone  duloxetine  levomilnacipran  linezolid  MAOIs like Carbex, Eldepryl, Marplan, Nardil, and Parnate  methylene blue (injected into a vein)  milnacipran  pimozide  thioridazine This medicine may also interact with the following medications:  amphetamines  aspirin and aspirin-like medicines  certain medicines for depression, anxiety, or psychotic disturbances  certain medicines for migraine headaches like almotriptan, eletriptan, frovatriptan, naratriptan, rizatriptan, sumatriptan, zolmitriptan  certain medicines for sleep  certain medicines that treat or prevent blood clots like dalteparin, enoxaparin, warfarin  cimetidine  clozapine  diuretics  fentanyl  furazolidone  indinavir  isoniazid  lithium  metoprolol  NSAIDS, medicines for pain and inflammation, like ibuprofen or naproxen  other medicines that prolong the QT interval (cause an abnormal heart rhythm) like dofetilide, ziprasidone  procarbazine  rasagiline  supplements like St. John's wort, kava kava, valerian  tramadol  tryptophan This list may not describe all possible interactions. Give your health care provider a list of all the medicines, herbs, non-prescription drugs, or dietary supplements you use. Also tell them if you smoke, drink alcohol, or use illegal drugs.  Some items may interact with your medicine. What should I watch for while using this medicine? Tell your doctor if your symptoms do not get better or  if they get worse. Visit your doctor or health care professional for regular checks on your progress. Because it may take several weeks to see the full effects of this medicine, it is important to continue your treatment as prescribed by your doctor. Patients and their families should watch out for new or worsening thoughts of suicide or depression. Also watch out for sudden changes in feelings such as feeling anxious, agitated, panicky, irritable, hostile, aggressive, impulsive, severely restless, overly excited and hyperactive, or not being able to sleep. If this happens, especially at the beginning of treatment or after a change in dose, call your health care professional. This medicine can cause an increase in blood pressure. Check with your doctor for instructions on monitoring your blood pressure while taking this medicine. You may get drowsy or dizzy. Do not drive, use machinery, or do anything that needs mental alertness until you know how this medicine affects you. Do not stand or sit up quickly, especially if you are an older patient. This reduces the risk of dizzy or fainting spells. Alcohol may interfere with the effect of this medicine. Avoid alcoholic drinks. Your mouth may get dry. Chewing sugarless gum, sucking hard candy and drinking plenty of water will help. Contact your doctor if the problem does not go away or is severe. What side effects may I notice from receiving this medicine? Side effects that you should report to your doctor or health care professional as soon as possible:  allergic reactions like skin rash, itching or hives, swelling of the face, lips, or tongue  anxious  breathing problems  confusion  changes in vision  chest pain  confusion  elevated mood, decreased need for sleep, racing thoughts, impulsive behavior  eye pain  fast, irregular heartbeat  feeling faint or lightheaded, falls  feeling agitated, angry, or irritable  hallucination, loss of  contact with reality  high blood pressure  loss of balance or coordination  palpitations  redness, blistering, peeling or loosening of the skin, including inside the mouth  restlessness, pacing, inability to keep still  seizures  stiff muscles  suicidal thoughts or other mood changes  trouble passing urine or change in the amount of urine  trouble sleeping  unusual bleeding or bruising  unusually weak or tired  vomiting Side effects that usually do not require medical attention (report to your doctor or health care professional if they continue or are bothersome):  change in sex drive or performance  change in appetite or weight  constipation  dizziness  dry mouth  headache  increased sweating  nausea  tired This list may not describe all possible side effects. Call your doctor for medical advice about side effects. You may report side effects to FDA at 1-800-FDA-1088. Where should I keep my medicine? Keep out of the reach of children. Store at a controlled temperature between 20 and 25 degrees C (68 and 77 degrees F), in a dry place. Throw away any unused medicine after the expiration date. NOTE: This sheet is a summary. It may not cover all possible information. If you have questions about this medicine, talk to your doctor, pharmacist, or health care provider.  2020 Elsevier/Gold Standard (2018-10-21 12:08:23)  Menopause Menopause is the normal time of life when menstrual periods stop completely. It is usually confirmed by 12 months without a menstrual  period. The transition to menopause (perimenopause) most often happens between the ages of 5445 and 7955. During perimenopause, hormone levels change in your body, which can cause symptoms and affect your health. Menopause may increase your risk for:  Loss of bone (osteoporosis), which causes bone breaks (fractures).  Depression.  Hardening and narrowing of the arteries (atherosclerosis), which can cause  heart attacks and strokes. What are the causes? This condition is usually caused by a natural change in hormone levels that happens as you get older. The condition may also be caused by surgery to remove both ovaries (bilateral oophorectomy). What increases the risk? This condition is more likely to start at an earlier age if you have certain medical conditions or treatments, including:  A tumor of the pituitary gland in the brain.  A disease that affects the ovaries and hormone production.  Radiation treatment for cancer.  Certain cancer treatments, such as chemotherapy or hormone (anti-estrogen) therapy.  Heavy smoking and excessive alcohol use.  Family history of early menopause. This condition is also more likely to develop earlier in women who are very thin. What are the signs or symptoms? Symptoms of this condition include:  Hot flashes.  Irregular menstrual periods.  Night sweats.  Changes in feelings about sex. This could be a decrease in sex drive or an increased comfort around your sexuality.  Vaginal dryness and thinning of the vaginal walls. This may cause painful intercourse.  Dryness of the skin and development of wrinkles.  Headaches.  Problems sleeping (insomnia).  Mood swings or irritability.  Memory problems.  Weight gain.  Hair growth on the face and chest.  Bladder infections or problems with urinating. How is this diagnosed? This condition is diagnosed based on your medical history, a physical exam, your age, your menstrual history, and your symptoms. Hormone tests may also be done. How is this treated? In some cases, no treatment is needed. You and your health care provider should make a decision together about whether treatment is necessary. Treatment will be based on your individual condition and preferences. Treatment for this condition focuses on managing symptoms. Treatment may include:  Menopausal hormone therapy (MHT).  Medicines to  treat specific symptoms or complications.  Acupuncture.  Vitamin or herbal supplements. Before starting treatment, make sure to let your health care provider know if you have a personal or family history of:  Heart disease.  Breast cancer.  Blood clots.  Diabetes.  Osteoporosis. Follow these instructions at home: Lifestyle  Do not use any products that contain nicotine or tobacco, such as cigarettes and e-cigarettes. If you need help quitting, ask your health care provider.  Get at least 30 minutes of physical activity on 5 or more days each week.  Avoid alcoholic and caffeinated beverages, as well as spicy foods. This may help prevent hot flashes.  Get 7-8 hours of sleep each night.  If you have hot flashes, try: ? Dressing in layers. ? Avoiding things that may trigger hot flashes, such as spicy food, warm places, or stress. ? Taking slow, deep breaths when a hot flash starts. ? Keeping a fan in your home and office.  Find ways to manage stress, such as deep breathing, meditation, or journaling.  Consider going to group therapy with other women who are having menopause symptoms. Ask your health care provider about recommended group therapy meetings. Eating and drinking  Eat a healthy, balanced diet that contains whole grains, lean protein, low-fat dairy, and plenty of fruits and vegetables.  Your health care provider may recommend adding more soy to your diet. Foods that contain soy include tofu, tempeh, and soy milk.  Eat plenty of foods that contain calcium and vitamin D for bone health. Items that are rich in calcium include low-fat milk, yogurt, beans, almonds, sardines, broccoli, and kale. Medicines  Take over-the-counter and prescription medicines only as told by your health care provider.  Talk with your health care provider before starting any herbal supplements. If prescribed, take vitamins and supplements as told by your health care provider. These may  include: ? Calcium. Women age 57 and older should get 1,200 mg (milligrams) of calcium every day. ? Vitamin D. Women need 600-800 International Units of vitamin D each day. ? Vitamins B12 and B6. Aim for 50 micrograms of B12 and 1.5 mg of B6 each day. General instructions  Keep track of your menstrual periods, including: ? When they occur. ? How heavy they are and how long they last. ? How much time passes between periods.  Keep track of your symptoms, noting when they start, how often you have them, and how long they last.  Use vaginal lubricants or moisturizers to help with vaginal dryness and improve comfort during sex.  Keep all follow-up visits as told by your health care provider. This is important. This includes any group therapy or counseling. Contact a health care provider if:  You are still having menstrual periods after age 35.  You have pain during sex.  You have not had a period for 12 months and you develop vaginal bleeding. Get help right away if:  You have: ? Severe depression. ? Excessive vaginal bleeding. ? Pain when you urinate. ? A fast or irregular heart beat (palpitations). ? Severe headaches. ? Abdomen (abdominal) pain or severe indigestion.  You fell and you think you have a broken bone.  You develop leg or chest pain.  You develop vision problems.  You feel a lump in your breast. Summary  Menopause is the normal time of life when menstrual periods stop completely. It is usually confirmed by 12 months without a menstrual period.  The transition to menopause (perimenopause) most often happens between the ages of 41 and 11.  Symptoms can be managed through medicines, lifestyle changes, and complementary therapies such as acupuncture.  Eat a balanced diet that is rich in nutrients to promote bone health and heart health and to manage symptoms during menopause. This information is not intended to replace advice given to you by your health care  provider. Make sure you discuss any questions you have with your health care provider. Document Revised: 10/11/2017 Document Reviewed: 12/01/2016 Elsevier Patient Education  2020 Elsevier Inc.  High Cholesterol  High cholesterol is a condition in which the blood has high levels of a white, waxy, fat-like substance (cholesterol). The human body needs small amounts of cholesterol. The liver makes all the cholesterol that the body needs. Extra (excess) cholesterol comes from the food that we eat. Cholesterol is carried from the liver by the blood through the blood vessels. If you have high cholesterol, deposits (plaques) may build up on the walls of your blood vessels (arteries). Plaques make the arteries narrower and stiffer. Cholesterol plaques increase your risk for heart attack and stroke. Work with your health care provider to keep your cholesterol levels in a healthy range. What increases the risk? This condition is more likely to develop in people who:  Eat foods that are high in animal fat (saturated fat) or  cholesterol.  Are overweight.  Are not getting enough exercise.  Have a family history of high cholesterol. What are the signs or symptoms? There are no symptoms of this condition. How is this diagnosed? This condition may be diagnosed from the results of a blood test.  If you are older than age 55, your health care provider may check your cholesterol every 4-6 years.  You may be checked more often if you already have high cholesterol or other risk factors for heart disease. The blood test for cholesterol measures:  "Bad" cholesterol (LDL cholesterol). This is the main type of cholesterol that causes heart disease. The desired level for LDL is less than 100.  "Good" cholesterol (HDL cholesterol). This type helps to protect against heart disease by cleaning the arteries and carrying the LDL away. The desired level for HDL is 60 or higher.  Triglycerides. These are fats that  the body can store or burn for energy. The desired number for triglycerides is lower than 150.  Total cholesterol. This is a measure of the total amount of cholesterol in your blood, including LDL cholesterol, HDL cholesterol, and triglycerides. A healthy number is less than 200. How is this treated? This condition is treated with diet changes, lifestyle changes, and medicines. Diet changes  This may include eating more whole grains, fruits, vegetables, nuts, and fish.  This may also include cutting back on red meat and foods that have a lot of added sugar. Lifestyle changes  Changes may include getting at least 40 minutes of aerobic exercise 3 times a week. Aerobic exercises include walking, biking, and swimming. Aerobic exercise along with a healthy diet can help you maintain a healthy weight.  Changes may also include quitting smoking. Medicines  Medicines are usually given if diet and lifestyle changes have failed to reduce your cholesterol to healthy levels.  Your health care provider may prescribe a statin medicine. Statin medicines have been shown to reduce cholesterol, which can reduce the risk of heart disease. Follow these instructions at home: Eating and drinking If told by your health care provider:  Eat chicken (without skin), fish, veal, shellfish, ground [place] breast, and round or loin cuts of red meat.  Do not eat fried foods or fatty meats, such as hot dogs and salami.  Eat plenty of fruits, such as apples.  Eat plenty of vegetables, such as broccoli, potatoes, and carrots.  Eat beans, peas, and lentils.  Eat grains such as barley, rice, couscous, and bulgur wheat.  Eat pasta without cream sauces.  Use skim or nonfat milk, and eat low-fat or nonfat yogurt and cheeses.  Do not eat or drink whole milk, cream, ice cream, egg yolks, or hard cheeses.  Do not eat stick margarine or tub margarines that contain trans fats (also called partially hydrogenated oils).   Do not eat saturated tropical oils, such as coconut oil and palm oil.  Do not eat cakes, cookies, crackers, or other baked goods that contain trans fats.  General instructions  Exercise as directed by your health care provider. Increase your activity level with activities such as gardening, walking, and taking the stairs.  Take over-the-counter and prescription medicines only as told by your health care provider.  Do not use any products that contain nicotine or tobacco, such as cigarettes and e-cigarettes. If you need help quitting, ask your health care provider.  Keep all follow-up visits as told by your health care provider. This is important. Contact a health care provider if:  You are struggling to maintain a healthy diet or weight.  You need help to start on an exercise program.  You need help to stop smoking. Get help right away if:  You have chest pain.  You have trouble breathing. This information is not intended to replace advice given to you by your health care provider. Make sure you discuss any questions you have with your health care provider. Document Revised: 11/01/2017 Document Reviewed: 04/28/2016 Elsevier Patient Education  [place].  Cholesterol Content in Foods Cholesterol is a waxy, fat-like substance that helps to carry fat in the blood. The body needs cholesterol in small amounts, but too much cholesterol can cause damage to the arteries and heart. Most people should eat less than 200 milligrams (mg) of cholesterol a day. Foods with cholesterol  Cholesterol is found in animal-based foods, such as meat, seafood, and dairy. Generally, low-fat dairy and lean meats have less cholesterol than full-fat dairy and fatty meats. The milligrams of cholesterol per serving (mg per serving) of common cholesterol-containing foods are listed below. Meat and other proteins  Egg - one large whole egg has 186 mg.  Veal shank - 4 oz has 141 mg.  Lean ground  [place] (93% lean) - 4 oz has 118 mg.  Fat-trimmed lamb loin - 4 oz has 106 mg.  Lean ground beef (90% lean) - 4 oz has 100 mg.  Lobster - 3.5 oz has 90 mg.  Pork loin chops - 4 oz has 86 mg.  Canned salmon - 3.5 oz has 83 mg.  Fat-trimmed beef top loin - 4 oz has 78 mg.  Frankfurter - 1 frank (3.5 oz) has 77 mg.  Crab - 3.5 oz has 71 mg.  Roasted chicken without skin, white meat - 4 oz has 66 mg.  Light bologna - 2 oz has 45 mg.  Deli-cut [place] - 2 oz has 31 mg.  Canned tuna - 3.5 oz has 31 mg.  Bacon - 1 oz has 29 mg.  Oysters and mussels (raw) - 3.5 oz has 25 mg.  Mackerel - 1 oz has 22 mg.  Trout - 1 oz has 20 mg.  Pork sausage - 1 link (1 oz) has 17 mg.  Salmon - 1 oz has 16 mg.  Tilapia - 1 oz has 14 mg. Dairy  Soft-serve ice cream -  cup (4 oz) has 103 mg.  Whole-milk yogurt - 1 cup (8 oz) has 29 mg.  Cheddar cheese - 1 oz has 28 mg.  American cheese - 1 oz has 28 mg.  Whole milk - 1 cup (8 oz) has 23 mg.  2% milk - 1 cup (8 oz) has 18 mg.  Cream cheese - 1 tablespoon (Tbsp) has 15 mg.  Cottage cheese -  cup (4 oz) has 14 mg.  Low-fat (1%) milk - 1 cup (8 oz) has 10 mg.  Sour cream - 1 Tbsp has 8.5 mg.  Low-fat yogurt - 1 cup (8 oz) has 8 mg.  Nonfat Greek yogurt - 1 cup (8 oz) has 7 mg.  Half-and-half cream - 1 Tbsp has 5 mg. Fats and oils  Cod liver oil - 1 tablespoon (Tbsp) has 82 mg.  Butter - 1 Tbsp has 15 mg.  Lard - 1 Tbsp has 14 mg.  Bacon grease - 1 Tbsp has 14 mg.  Mayonnaise - 1 Tbsp has 5-10 mg.  Margarine - 1 Tbsp has 3-10 mg. Exact amounts of cholesterol in these foods may vary  depending on specific ingredients and brands. Foods without cholesterol Most plant-based foods do not have cholesterol unless you combine them with a food that has cholesterol. Foods without cholesterol include:  Grains and cereals.  Vegetables.  Fruits.  Vegetable oils, such as olive, canola, and sunflower oil.  Legumes, such as  peas, beans, and lentils.  Nuts and seeds.  Egg whites. Summary  The body needs cholesterol in small amounts, but too much cholesterol can cause damage to the arteries and heart.  Most people should eat less than 200 milligrams (mg) of cholesterol a day. This information is not intended to replace advice given to you by your health care provider. Make sure you discuss any questions you have with your health care provider. Document Revised: 10/11/2017 Document Reviewed: 06/25/2017 Elsevier Patient Education  2020 Else[employer] Inc.  Prediabetes Eating Plan Prediabetes is a condition that causes blood sugar (glucose) levels to be higher than normal. This increases the risk for developing diabetes. In order to prevent diabetes from developing, your health care provider may recommend a diet and other lifestyle changes to help you:  Control your blood glucose levels.  Improve your cholesterol levels.  Manage your blood pressure. Your health care provider may recommend working with a diet and nutrition specialist (dietitian) to make a meal plan that is best for you. What are tips for following this plan? Lifestyle  Set weight loss goals with the help of your health care team. It is recommended that most people with prediabetes lose 7% of their current body weight.  Exercise for at least 30 minutes at least 5 days a week.  Attend a support group or seek ongoing support from a mental health counselor.  Take over-the-counter and prescription medicines only as told by your health care provider. Reading food labels  Read food labels to check the amount of fat, salt (sodium), and sugar in prepackaged foods. Avoid foods that have: ? Saturated fats. ? Trans fats. ? Added sugars.  Avoid foods that have more than 300 milligrams (mg) of sodium per serving. Limit your daily sodium intake to less than 2,300 mg each day. Shopping  Avoid buying pre-made and processed foods. Cooking  Cook with  olive oil. Do not use butter, lard, or ghee.  Bake, broil, grill, or boil foods. Avoid frying. Meal planning   Work with your dietitian to develop an eating plan that is right for you. This may include: ? Tracking how many calories you take in. Use a food diary, notebook, or mobile application to track what you eat at each meal. ? Using the glycemic index (GI) to plan your meals. The index tells you how quickly a food will raise your blood glucose. Choose low-GI foods. These foods take a longer time to raise blood glucose.  Consider following a Mediterranean diet. This diet includes: ? Several servings each day of fresh fruits and vegetables. ? Eating fish at least twice a week. ? Several servings each day of whole grains, beans, nuts, and seeds. ? Using olive oil instead of other fats. ? Moderate alcohol consumption. ? Eating small amounts of red meat and whole-fat dairy.  If you have high blood pressure, you may need to limit your sodium intake or follow a diet such as the DASH eating plan. DASH is an eating plan that aims to lower high blood pressure. What foods are recommended? The items listed below may not be a complete list. Talk with your dietitian about what dietary choices are best for  you. Grains Whole grains, such as whole-wheat or whole-grain breads, crackers, cereals, and pasta. Unsweetened oatmeal. Bulgur. Barley. Quinoa. Brown rice. Corn or whole-wheat flour tortillas or taco shells. Vegetables Lettuce. Spinach. Peas. Beets. Cauliflower. Cabbage. Broccoli. Carrots. Tomatoes. Squash. Eggplant. Herbs. Peppers. Onions. Cucumbers. Brussels sprouts. Fruits Berries. Bananas. Apples. Oranges. Grapes. Papaya. Mango. Pomegranate. Kiwi. Grapefruit. Cherries. Meats and other protein foods Seafood. Poultry without skin. Lean cuts of pork and beef. Tofu. Eggs. Nuts. Beans. Dairy Low-fat or fat-free dairy products, such as yogurt, cottage cheese, and cheese. Beverages Water. Tea.  Coffee. Sugar-free or diet soda. Seltzer water. Lowfat or no-fat milk. Milk alternatives, such as soy or almond milk. Fats and oils Olive oil. Canola oil. Sunflower oil. Grapeseed oil. Avocado. Walnuts. Sweets and desserts Sugar-free or low-fat pudding. Sugar-free or low-fat ice cream and other frozen treats. Seasoning and other foods Herbs. Sodium-free spices. Mustard. Relish. Low-fat, low-sugar ketchup. Low-fat, low-sugar barbecue sauce. Low-fat or fat-free mayonnaise. What foods are not recommended? The items listed below may not be a complete list. Talk with your dietitian about what dietary choices are best for you. Grains Refined white flour and flour products, such as bread, pasta, snack foods, and cereals. Vegetables Canned vegetables. Frozen vegetables with butter or cream sauce. Fruits Fruits canned with syrup. Meats and other protein foods Fatty cuts of meat. Poultry with skin. Breaded or fried meat. Processed meats. Dairy Full-fat yogurt, cheese, or milk. Beverages Sweetened drinks, such as sweet iced tea and soda. Fats and oils Butter. Lard. Ghee. Sweets and desserts Baked goods, such as cake, cupcakes, pastries, cookies, and cheesecake. Seasoning and other foods Spice mixes with added salt. Ketchup. Barbecue sauce. Mayonnaise. Summary  To prevent diabetes from developing, you may need to make diet and other lifestyle changes to help control blood sugar, improve cholesterol levels, and manage your blood pressure.  Set weight loss goals with the help of your health care team. It is recommended that most people with prediabetes lose 7 percent of their current body weight.  Consider following a Mediterranean diet that includes plenty of fresh fruits and vegetables, whole grains, beans, nuts, seeds, fish, lean meat, low-fat dairy, and healthy oils. This information is not intended to replace advice given to you by your health care provider. Make sure you discuss any  questions you have with your health care provider. Document Revised: 02/20/2019 Document Reviewed: 01/02/2017 Elsevier Patient Education  2020 [employer].

## 2020-03-11 NOTE — Progress Notes (Signed)
Chief Complaint  Patient presents with  . Annual Exam   Annual  1. Prediabetes 6.3 and LDL 109 she has a sweet tooth 2. Anxiety depression PHQ 9 score 9 and GAD 8 score 3 and having hot flashes disc tx options effexor, gabapentin, clonidine she will think about effexor 3. HTN controlled on norvasc 5 mg qd hctz 25 mg qd  Review of Systems  Constitutional: Negative for weight loss.  HENT: Negative for hearing loss.   Eyes: Negative for blurred vision.  Respiratory: Negative for shortness of breath.   Cardiovascular: Negative for chest pain.  Gastrointestinal: Negative for abdominal pain.  Genitourinary:       +hot flashes   Musculoskeletal: Negative for falls.  Skin: Negative for rash.  Neurological: Negative for headaches.  Psychiatric/Behavioral: Negative for depression.   Past Medical History:  Diagnosis Date  . Arthritis    left shoulder  . Cataract    ? which eye mild   . Diabetes mellitus without complication ([place])   . GERD (gastroesophageal reflux disease)   . Hypertension   . Migraines   . OSA on CPAP   . Prediabetes   . Sleep apnea   . UTI (urinary tract infection)    Past Surgical History:  Procedure Laterality Date  . ABDOMINAL HYSTERECTOMY     fibroids   Family History  Problem Relation Age of Onset  . Diabetes Father   . Stroke Father   . Seizures Father   . Heart disease Mother   . Diabetes Mother   . Other Sister        in 76s [place]   . Cancer Maternal Grandmother        pancreatitic   . Breast cancer Neg Hx    Social History   Socioeconomic History  . Marital status: Legally Separated    Spouse name: Not on file  . Number of children: Not on file  . Years of education: Not on file  . Highest education level: Not on file  Occupational History  . Not on file  Tobacco Use  . Smoking status: Never Smoker  . Smokeless tobacco: Never Used  Substance and Sexual Activity  . Alcohol use: Yes    Alcohol/week: 0.0 standard drinks   Comment: 1-2 per month  . Drug use: Not on file  . Sexual activity: Not on file  Other Topics Concern  . Not on file  Social History Narrative   Separated as of 2 months 11/2018    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    2 kids    Drinks occasionally, never smoker    No guns, wears seat belt    BA degree   Social Determinants of Health   Financial Resource Strain:   . Difficulty of Paying Living Expenses:   Food Insecurity:   . Worried About Charity fundraiser in the Last Year:   . Arboriculturist in the Last Year:   Transportation Needs:   . Film/video editor (Medical):   Marland Kitchen Lack of Transportation (Non-Medical):   Physical Activity:   . Days of Exercise per Week:   . Minutes of Exercise per Session:   Stress:   . Feeling of Stress :   Social Connections:   . Frequency of Communication with Friends and Family:   . Frequency of Social Gatherings with Friends and Family:   . Attends Religious Services:   . Active Member of Clubs or Organizations:   . Attends Club  or Organization Meetings:   Marland Kitchen Marital Status:   Intimate Partner Violence:   . Fear of Current or Ex-Partner:   . Emotionally Abused:   Marland Kitchen Physically Abused:   . Sexually Abused:    Current Meds  Medication Sig  . amLODipine (NORVASC) 5 MG tablet Take 1 tablet (5 mg total) by mouth at bedtime.  . Calcium Carb-Cholecalciferol (CALTRATE 600+D3) 600-800 MG-UNIT TABS Take 1 tablet by mouth 2 (two) times daily with a meal.  . hydrochlorothiazide (HYDRODIURIL) 25 MG tablet Take 1 tablet (25 mg total) by mouth daily. In am d/c benicar 20-12.5 mg daily  . [DISCONTINUED] amLODipine (NORVASC) 5 MG tablet Take 1 tablet (5 mg total) by mouth at bedtime.  . [DISCONTINUED] hydrochlorothiazide (HYDRODIURIL) 25 MG tablet Take 1 tablet (25 mg total) by mouth daily. In am d/c benicar 20-12.5 mg daily   No Known Allergies Recent Results (from the past 2160 hour(s))  Urinalysis, Routine w reflex microscopic     Status: None    Collection Time: 03/10/20  8:59 AM  Result Value Ref Range   Color, Urine YELLOW YELLOW   APPearance CLEAR CLEAR   Specific Gravity, Urine 1.007 1.001 - 1.03   pH 7.5 5.0 - 8.0   Glucose, UA NEGATIVE NEGATIVE   Bilirubin Urine NEGATIVE NEGATIVE   Ketones, ur NEGATIVE NEGATIVE   Hgb urine dipstick NEGATIVE NEGATIVE   Protein, ur NEGATIVE NEGATIVE   Nitrite NEGATIVE NEGATIVE   Leukocytes,Ua NEGATIVE NEGATIVE  TSH     Status: None   Collection Time: 03/10/20  8:59 AM  Result Value Ref Range   TSH 1.67 0.35 - 4.50 uIU/mL  Hemoglobin A1c     Status: None   Collection Time: 03/10/20  8:59 AM  Result Value Ref Range   Hgb A1c MFr Bld 6.3 4.6 - 6.5 %    Comment: Glycemic Control Guidelines for People with Diabetes:Non Diabetic:  <6%Goal of Therapy: <7%Additional Action Suggested:  >8%   CBC with Differential/Platelet     Status: None   Collection Time: 03/10/20  8:59 AM  Result Value Ref Range   WBC 7.1 4.0 - 10.5 K/uL   RBC 4.42 3.87 - 5.11 Mil/uL   Hemoglobin 13.2 12.0 - 15.0 g/dL   HCT 39.6 36.0 - 46.0 %   MCV 89.6 78.0 - 100.0 fl   MCHC 33.2 30.0 - 36.0 g/dL   RDW 13.0 11.5 - 15.5 %   Platelets 242.0 150.0 - 400.0 K/uL   Neutrophils Relative % 60.7 43.0 - 77.0 %   Lymphocytes Relative 27.8 12.0 - 46.0 %   Monocytes Relative 8.9 3.0 - 12.0 %   Eosinophils Relative 2.0 0.0 - 5.0 %   Basophils Relative 0.6 0.0 - 3.0 %   Neutro Abs 4.3 1.4 - 7.7 K/uL   Lymphs Abs 2.0 0.7 - 4.0 K/uL   Monocytes Absolute 0.6 0.1 - 1.0 K/uL   Eosinophils Absolute 0.1 0.0 - 0.7 K/uL   Basophils Absolute 0.0 0.0 - 0.1 K/uL  Comprehensive metabolic panel     Status: Abnormal   Collection Time: 03/10/20  8:59 AM  Result Value Ref Range   Sodium 139 135 - 145 mEq/L   Potassium 3.9 3.5 - 5.1 mEq/L   Chloride 102 96 - 112 mEq/L   CO2 32 19 - 32 mEq/L   Glucose, Bld 106 (H) 70 - 99 mg/dL   BUN 19 6 - 23 mg/dL   Creatinine, Ser 0.91 0.40 - 1.20 mg/dL   Total  Bilirubin 0.5 0.2 - 1.2 mg/dL   Alkaline  Phosphatase 64 39 - 117 U/L   AST 16 0 - 37 U/L   ALT 15 0 - 35 U/L   Total Protein 7.3 6.0 - 8.3 g/dL   Albumin 4.5 3.5 - 5.2 g/dL   GFR 76.02 >60.00 mL/min   Calcium 9.8 8.4 - 10.5 mg/dL  Lipid panel     Status: Abnormal   Collection Time: 03/10/20  8:59 AM  Result Value Ref Range   Cholesterol 172 0 - 200 mg/dL    Comment: ATP III Classification       Desirable:  < 200 mg/dL               Borderline High:  200 - 239 mg/dL          High:  > = 240 mg/dL   Triglycerides 80.0 0.0 - 149.0 mg/dL    Comment: Normal:  <150 mg/dLBorderline High:  150 - 199 mg/dL   HDL 46.60 >39.00 mg/dL   VLDL 16.0 0.0 - 40.0 mg/dL   LDL Cholesterol 109 (H) 0 - 99 mg/dL   Total CHOL/HDL Ratio 4     Comment:                Men          Women1/2 Average Risk     3.4          3.3Average Risk          5.0          4.42X Average Risk          9.6          7.13X Average Risk          15.0          11.0                       NonHDL 125.33     Comment: NOTE:  Non-HDL goal should be 30 mg/dL higher than patient's LDL goal (i.e. LDL goal of < 70 mg/dL, would have non-HDL goal of < 100 mg/dL)   Objective  Body mass index is 29.76 kg/m. Wt Readings from Last 3 Encounters:  03/11/20 190 lb (86.2 kg)  02/03/19 185 lb 3.2 oz (84 kg)  09/01/18 185 lb 1.6 oz (84 kg)   Temp Readings from Last 3 Encounters:  03/11/20 (!) 97 F (36.1 C) (Temporal)  02/03/19 97.9 F (36.6 C) (Oral)   BP Readings from Last 3 Encounters:  03/11/20 118/70  02/03/19 (!) 150/80  09/01/18 130/78   Pulse Readings from Last 3 Encounters:  03/11/20 87  02/03/19 70    Physical Exam Vitals and nursing note reviewed.  Constitutional:      Appearance: Normal appearance. She is well-developed, well-groomed and overweight.  HENT:     Head: Normocephalic and atraumatic.  Eyes:     Conjunctiva/sclera: Conjunctivae normal.     Pupils: Pupils are equal, round, and reactive to light.  Cardiovascular:     Rate and Rhythm: Normal rate and  regular rhythm.     Heart sounds: Normal heart sounds. No murmur.  Pulmonary:     Effort: Pulmonary effort is normal.     Breath sounds: Normal breath sounds.  Skin:    General: Skin is warm and dry.  Neurological:     General: No focal deficit present.     Mental Status: She is alert and oriented to  person, place, and time. Mental status is at baseline.     Gait: Gait normal.  Psychiatric:        Attention and Perception: Attention and perception normal.        Mood and Affect: Mood and affect normal.        Speech: Speech normal.        Behavior: Behavior normal. Behavior is cooperative.        Thought Content: Thought content normal.        Cognition and Memory: Cognition and memory normal.        Judgment: Judgment normal.     Assessment  Plan  Annual physical exam Declines flu shot  Tdap 10/03/15  Consider shingrix  Had J&J x 1  Consider hep B,MMR in future    mammo 10/2019 negative ordered repeat  EGD 08/09/10 mild gastritis  Colonoscopy Dr. Tiffany Kocher 2015 get report 10/05/14 repeat due 10/05/24  S/p hysterectomy no h/o abnl had in 2012  DEXA 12/13/16 osteopenia rec calcium and vitamin  HCV 09/30/17 negative  [facility] eye 2020 or 2021  Dentist Dr. Lynelle Smoke  [facility] [address] call back if interested left breast SK removal and will refer     Essential hypertension - Plan: hydrochlorothiazide (HYDRODIURIL) 25 MG tablet, amLODipine (NORVASC) 5 MG tablet Controlled   Menopause - Plan: venlafaxine XR (EFFEXOR XR) 37.5 MG 24 hr capsule Anxiety and depression - Plan: venlafaxine XR (EFFEXOR XR) 37.5 MG 24 hr capsule  She may fill  Disc therapy consider at work or [employer] [facility]   Provider: Dr. Olivia Mackie McLean-Scocuzza-[facility]

## 2020-03-21 ENCOUNTER — Encounter: Payer: Self-pay | Admitting: Internal Medicine

## 2020-03-22 ENCOUNTER — Telehealth: Payer: Self-pay | Admitting: Internal Medicine

## 2020-03-22 NOTE — Telephone Encounter (Signed)
Advise pt I would not do solicitation screen  TMS

## 2020-03-22 NOTE — Telephone Encounter (Signed)
Response sent via mychart encounter initiated by the patient.

## 2020-03-31 DIAGNOSIS — G4733 Obstructive sleep apnea (adult) (pediatric): Secondary | ICD-10-CM | POA: Diagnosis not present

## 2020-04-25 ENCOUNTER — Encounter: Payer: Self-pay | Admitting: Internal Medicine

## 2020-05-19 ENCOUNTER — Encounter: Payer: Self-pay | Admitting: Internal Medicine

## 2020-05-20 ENCOUNTER — Encounter: Payer: Self-pay | Admitting: Internal Medicine

## 2020-06-01 ENCOUNTER — Encounter: Payer: Self-pay | Admitting: Internal Medicine

## 2020-07-11 DIAGNOSIS — H04223 Epiphora due to insufficient drainage, bilateral lacrimal glands: Secondary | ICD-10-CM | POA: Diagnosis not present

## 2020-07-14 DIAGNOSIS — Z20822 Contact with and (suspected) exposure to covid-19: Secondary | ICD-10-CM | POA: Diagnosis not present

## 2020-08-30 DIAGNOSIS — G4733 Obstructive sleep apnea (adult) (pediatric): Secondary | ICD-10-CM | POA: Diagnosis not present

## 2020-09-01 ENCOUNTER — Encounter: Payer: Self-pay | Admitting: Internal Medicine

## 2020-09-01 NOTE — Telephone Encounter (Signed)
Per Dr French Ana McLean-Scocuzza instructions from 03/11/20 visit.    Return for fasting labs 03/10/21 f/u 1 year CPE.

## 2020-10-25 LAB — HM DIABETES EYE EXAM

## 2020-10-26 ENCOUNTER — Encounter: Payer: Self-pay | Admitting: Internal Medicine

## 2020-11-02 ENCOUNTER — Ambulatory Visit
Admission: RE | Admit: 2020-11-02 | Discharge: 2020-11-02 | Disposition: A | Discharge status: Home / Self Care | Payer: BC Managed Care – PPO | Source: Ambulatory Visit | Attending: Internal Medicine | Admitting: Internal Medicine

## 2020-11-02 ENCOUNTER — Other Ambulatory Visit: Payer: Self-pay

## 2020-11-02 DIAGNOSIS — Z1231 Encounter for screening mammogram for malignant neoplasm of breast: Secondary | ICD-10-CM | POA: Insufficient documentation

## 2020-11-24 DIAGNOSIS — H04551 Acquired stenosis of right nasolacrimal duct: Secondary | ICD-10-CM | POA: Diagnosis not present

## 2021-01-09 ENCOUNTER — Encounter: Payer: Self-pay | Admitting: Internal Medicine

## 2021-03-13 ENCOUNTER — Other Ambulatory Visit: Payer: Self-pay

## 2021-03-13 ENCOUNTER — Other Ambulatory Visit (INDEPENDENT_AMBULATORY_CARE_PROVIDER_SITE_OTHER): Payer: BC Managed Care – PPO

## 2021-03-13 DIAGNOSIS — Z0000 Encounter for general adult medical examination without abnormal findings: Secondary | ICD-10-CM | POA: Diagnosis not present

## 2021-03-13 DIAGNOSIS — Z1329 Encounter for screening for other suspected endocrine disorder: Secondary | ICD-10-CM

## 2021-03-13 DIAGNOSIS — I10 Essential (primary) hypertension: Secondary | ICD-10-CM

## 2021-03-13 DIAGNOSIS — E785 Hyperlipidemia, unspecified: Secondary | ICD-10-CM | POA: Diagnosis not present

## 2021-03-13 DIAGNOSIS — Z1389 Encounter for screening for other disorder: Secondary | ICD-10-CM | POA: Diagnosis not present

## 2021-03-13 DIAGNOSIS — Z113 Encounter for screening for infections with a predominantly sexual mode of transmission: Secondary | ICD-10-CM

## 2021-03-13 DIAGNOSIS — R7303 Prediabetes: Secondary | ICD-10-CM | POA: Diagnosis not present

## 2021-03-13 DIAGNOSIS — E559 Vitamin D deficiency, unspecified: Secondary | ICD-10-CM

## 2021-03-13 LAB — HEMOGLOBIN A1C: Hgb A1c MFr Bld: 6.4 % (ref 4.6–6.5)

## 2021-03-13 LAB — LIPID PANEL
Cholesterol: 202 mg/dL — ABNORMAL HIGH (ref 0–200)
HDL: 43.2 mg/dL (ref >39.00)
LDL Cholesterol: 131 mg/dL — ABNORMAL HIGH (ref 0–99)
NonHDL: 158.49
Total CHOL/HDL Ratio: 5
Triglycerides: 135 mg/dL (ref 0.0–149.0)
VLDL: 27 mg/dL (ref 0.0–40.0)

## 2021-03-13 LAB — CBC WITH DIFFERENTIAL/PLATELET
Basophils Absolute: 0 10*3/uL (ref 0.0–0.1)
Basophils Relative: 0.7 % (ref 0.0–3.0)
Eosinophils Absolute: 0.2 10*3/uL (ref 0.0–0.7)
Eosinophils Relative: 2.1 % (ref 0.0–5.0)
HCT: 41.5 % (ref 36.0–46.0)
Hemoglobin: 13.9 g/dL (ref 12.0–15.0)
Lymphocytes Relative: 25.7 % (ref 12.0–46.0)
Lymphs Abs: 1.9 10*3/uL (ref 0.7–4.0)
MCHC: 33.6 g/dL (ref 30.0–36.0)
MCV: 88.5 fl (ref 78.0–100.0)
Monocytes Absolute: 0.6 10*3/uL (ref 0.1–1.0)
Monocytes Relative: 7.5 % (ref 3.0–12.0)
Neutro Abs: 4.8 10*3/uL (ref 1.4–7.7)
Neutrophils Relative %: 64 % (ref 43.0–77.0)
Platelets: 262 10*3/uL (ref 150.0–400.0)
RBC: 4.69 Mil/uL (ref 3.87–5.11)
RDW: 12.9 % (ref 11.5–15.5)
WBC: 7.5 10*3/uL (ref 4.0–10.5)

## 2021-03-13 LAB — TSH: TSH: 1.72 u[IU]/mL (ref 0.35–4.50)

## 2021-03-13 LAB — COMPREHENSIVE METABOLIC PANEL
ALT: 17 U/L (ref 0–35)
AST: 17 U/L (ref 0–37)
Albumin: 4.5 g/dL (ref 3.5–5.2)
Alkaline Phosphatase: 76 U/L (ref 39–117)
BUN: 16 mg/dL (ref 6–23)
CO2: 29 mEq/L (ref 19–32)
Calcium: 10 mg/dL (ref 8.4–10.5)
Chloride: 102 mEq/L (ref 96–112)
Creatinine, Ser: 1.02 mg/dL (ref 0.40–1.20)
GFR: 59.13 mL/min — ABNORMAL LOW (ref >60.00)
Glucose, Bld: 118 mg/dL — ABNORMAL HIGH (ref 70–99)
Potassium: 3.8 mEq/L (ref 3.5–5.1)
Sodium: 141 mEq/L (ref 135–145)
Total Bilirubin: 0.5 mg/dL (ref 0.2–1.2)
Total Protein: 7.2 g/dL (ref 6.0–8.3)

## 2021-03-13 LAB — VITAMIN D 25 HYDROXY (VIT D DEFICIENCY, FRACTURES): VITD: 45.07 ng/mL (ref 30.00–100.00)

## 2021-03-14 LAB — URINALYSIS, ROUTINE W REFLEX MICROSCOPIC
Bilirubin Urine: NEGATIVE
Glucose, UA: NEGATIVE
Hgb urine dipstick: NEGATIVE
Hyaline Cast: NONE SEEN /LPF
Ketones, ur: NEGATIVE
Nitrite: NEGATIVE
Protein, ur: NEGATIVE
RBC / HPF: NONE SEEN /HPF (ref 0–2)
Specific Gravity, Urine: 1.015 (ref 1.001–1.035)
WBC, UA: NONE SEEN /HPF (ref 0–5)
pH: 6.5 (ref 5.0–8.0)

## 2021-03-14 LAB — MICROSCOPIC MESSAGE

## 2021-03-14 LAB — HIV ANTIBODY (ROUTINE TESTING W REFLEX): HIV 1&2 Ab, 4th Generation: NONREACTIVE

## 2021-03-15 ENCOUNTER — Ambulatory Visit (INDEPENDENT_AMBULATORY_CARE_PROVIDER_SITE_OTHER): Payer: BC Managed Care – PPO | Admitting: Internal Medicine

## 2021-03-15 ENCOUNTER — Encounter: Payer: Self-pay | Admitting: Internal Medicine

## 2021-03-15 ENCOUNTER — Ambulatory Visit (INDEPENDENT_AMBULATORY_CARE_PROVIDER_SITE_OTHER): Payer: BC Managed Care – PPO

## 2021-03-15 ENCOUNTER — Other Ambulatory Visit: Payer: Self-pay

## 2021-03-15 VITALS — BP 108/70 | HR 88 | Temp 97.7°F | Ht 65.12 in | Wt 194.2 lb

## 2021-03-15 DIAGNOSIS — R7989 Other specified abnormal findings of blood chemistry: Secondary | ICD-10-CM | POA: Diagnosis not present

## 2021-03-15 DIAGNOSIS — N3000 Acute cystitis without hematuria: Secondary | ICD-10-CM

## 2021-03-15 DIAGNOSIS — M47812 Spondylosis without myelopathy or radiculopathy, cervical region: Secondary | ICD-10-CM

## 2021-03-15 DIAGNOSIS — M542 Cervicalgia: Secondary | ICD-10-CM

## 2021-03-15 DIAGNOSIS — Z0000 Encounter for general adult medical examination without abnormal findings: Secondary | ICD-10-CM | POA: Diagnosis not present

## 2021-03-15 DIAGNOSIS — I10 Essential (primary) hypertension: Secondary | ICD-10-CM | POA: Diagnosis not present

## 2021-03-15 DIAGNOSIS — E669 Obesity, unspecified: Secondary | ICD-10-CM

## 2021-03-15 DIAGNOSIS — Z1231 Encounter for screening mammogram for malignant neoplasm of breast: Secondary | ICD-10-CM

## 2021-03-15 HISTORY — DX: Spondylosis without myelopathy or radiculopathy, cervical region: M47.812

## 2021-03-15 MED ORDER — AMLODIPINE BESYLATE 5 MG PO TABS: 5.0000 mg | ORAL_TABLET | Freq: Every day | ORAL | 3 refills | Status: DC

## 2021-03-15 MED ORDER — HYDROCHLOROTHIAZIDE 25 MG PO TABS: 12.5000 mg | ORAL_TABLET | Freq: Every day | ORAL | 3 refills | Status: DC

## 2021-03-15 MED ORDER — CYCLOBENZAPRINE HCL 5 MG PO TABS: 5.0000 mg | ORAL_TABLET | Freq: Every evening | ORAL | 2 refills | Status: DC | PRN

## 2021-03-15 NOTE — Patient Instructions (Addendum)
Heat  bengay aspercream voltaren gel Muscle relaxer  Tylenol  Pfizer vaccine covid  Hydrate with 55-64 ounces daily  Allergies/post nasal drip nasal saline and allergy med allegra/claritin/xyzal/zyrtec   Gold bond talc free powder    Postnasal Drip Postnasal drip is the feeling of mucus going down the back of your throat. Mucus is a slimy substance that moistens and cleans your nose and throat, as well as the air pockets in face bones near your forehead and cheeks (sinuses). Small amounts of mucus pass from your nose and sinuses down the back of your throat all the time. This is normal. When you produce too much mucus or the mucus gets too thick, you can feel it. Some common causes of postnasal drip include:  Having more mucus because of: ? A cold or the flu. ? Allergies. ? Cold air. ? Certain medicines.  Having more mucus that is thicker because of: ? A sinus or nasal infection. ? Dry air. ? A food allergy. Follow these instructions at home: Relieving discomfort  Gargle with a salt-water mixture 3-4 times a day or as needed. To make a salt-water mixture, completely dissolve -1 tsp of salt in 1 cup of warm water.  If the air in your home is dry, use a humidifier to add moisture to the air.  Use a saline spray or container (neti pot) to flush out the nose (nasal irrigation). These methods can help clear away mucus and keep the nasal passages moist.   General instructions  Take over-the-counter and prescription medicines only as told by your health care provider.  Follow instructions from your health care provider about eating or drinking restrictions. You may need to avoid caffeine.  Avoid things that you know you are allergic to (allergens), like dust, mold, pollen, pets, or certain foods.  Drink enough fluid to keep your urine pale yellow.  Keep all follow-up visits as told by your health care provider. This is important. Contact a health care provider if:  You have  a fever.  You have a sore throat.  You have difficulty swallowing.  You have headache.  You have sinus pain.  You have a cough that does not go away.  The mucus from your nose becomes thick and is green or yellow in color.  You have cold or flu symptoms that last more than 10 days. Summary  Postnasal drip is the feeling of mucus going down the back of your throat.  If your health care provider approves, use nasal irrigation or a nasal spray 2?4 times a day.  Avoid things that you know you are allergic to (allergens), like dust, mold, pollen, pets, or certain foods. This information is not intended to replace advice given to you by your health care provider. Make sure you discuss any questions you have with your health care provider. Document Revised: 08/09/2020 Document Reviewed: 08/09/2020 Elsevier Patient Education  2021 [employer]     Zoster Vaccine, Recombinant injection-2 doses and space out with other vaccines by 1 month  What is this medicine? ZOSTER VACCINE (ZOS ter vak SEEN) is a vaccine used to reduce the risk of getting shingles. This vaccine is not used to treat shingles or nerve pain from shingles. This medicine may be used for other purposes; ask your health care provider or pharmacist if you have questions. COMMON BRAND NAME(S): [facility] What should I tell my health care provider before I take this medicine? They need to know if you have any of these  conditions:  cancer  immune system problems  an unusual or allergic reaction to Zoster vaccine, other medications, foods, dyes, or preservatives  pregnant or trying to get pregnant  breast-feeding How should I use this medicine? This vaccine is injected into a muscle. It is given by a health care provider. A copy of Vaccine Information Statements will be given before each vaccination. Be sure to read this information carefully each time. This sheet may change often. Talk to your health care provider  about the use of this vaccine in children. This vaccine is not approved for use in children. Overdosage: If you think you have taken too much of this medicine contact a poison control center or emergency room at once. NOTE: This medicine is only for you. Do not share this medicine with others. What if I miss a dose? Keep appointments for follow-up (booster) doses. It is important not to miss your dose. Call your health care provider if you are unable to keep an appointment. What may interact with this medicine?  medicines that suppress your immune system  medicines to treat cancer  steroid medicines like prednisone or cortisone This list may not describe all possible interactions. Give your health care provider a list of all the medicines, herbs, non-prescription drugs, or dietary supplements you use. Also tell them if you smoke, drink alcohol, or use illegal drugs. Some items may interact with your medicine. What should I watch for while using this medicine? Visit your health care provider regularly. This vaccine, like all vaccines, may not fully protect everyone. What side effects may I notice from receiving this medicine? Side effects that you should report to your doctor or health care professional as soon as possible:  allergic reactions (skin rash, itching or hives; swelling of the face, lips, or tongue)  trouble breathing Side effects that usually do not require medical attention (report these to your doctor or health care professional if they continue or are bothersome):  chills  headache  fever  nausea  pain, redness, or irritation at site where injected  tiredness  vomiting This list may not describe all possible side effects. Call your doctor for medical advice about side effects. You may report side effects to FDA at 1-800-FDA-1088. Where should I keep my medicine? This vaccine is only given by a health care provider. It will not be stored at home. NOTE: This sheet  is a summary. It may not cover all possible information. If you have questions about this medicine, talk to your doctor, pharmacist, or health care provider.  2021 Elsevier/Gold Standard (2019-12-04 16:23:07)   Cervical Strain and Sprain Rehab Ask your health care provider which exercises are safe for you. Do exercises exactly as told by your health care provider and adjust them as directed. It is normal to feel mild stretching, pulling, tightness, or discomfort as you do these exercises. Stop right away if you feel sudden pain or your pain gets worse. Do not begin these exercises until told by your health care provider. Stretching and range-of-motion exercises Cervical side bending 1. Using good posture, sit on a stable chair or stand up. 2. Without moving your shoulders, slowly tilt your left / right ear to your shoulder until you feel a stretch in the opposite side neck muscles. You should be looking straight ahead. 3. Hold for __________ seconds. 4. Repeat with the other side of your neck. Repeat __________ times. Complete this exercise __________ times a day.   Cervical rotation 1. Using good posture,  sit on a stable chair or stand up. 2. Slowly turn your head to the side as if you are looking over your left / right shoulder. ? Keep your eyes level with the ground. ? Stop when you feel a stretch along the side and the back of your neck. 3. Hold for __________ seconds. 4. Repeat this by turning to your other side. Repeat __________ times. Complete this exercise __________ times a day.   Thoracic extension and pectoral stretch 1. Roll a towel or a small blanket so it is about 4 inches (10 cm) in diameter. 2. Lie down on your back on a firm surface. 3. Put the towel lengthwise, under your spine in the middle of your back. It should not be under your shoulder blades. The towel should line up with your spine from your middle back to your lower back. 4. Put your hands behind your head and  let your elbows fall out to your sides. 5. Hold for __________ seconds. Repeat __________ times. Complete this exercise __________ times a day. Strengthening exercises Isometric upper cervical flexion 1. Lie on your back with a thin pillow behind your head and a small rolled-up towel under your neck. 2. Gently tuck your chin toward your chest and nod your head down to look toward your feet. Do not lift your head off the pillow. 3. Hold for __________ seconds. 4. Release the tension slowly. Relax your neck muscles completely before you repeat this exercise. Repeat __________ times. Complete this exercise __________ times a day. Isometric cervical extension 1. Stand about 6 inches (15 cm) away from a wall, with your back facing the wall. 2. Place a soft object, about 6-8 inches (15-20 cm) in diameter, between the back of your head and the wall. A soft object could be a small pillow, a ball, or a folded towel. 3. Gently tilt your head back and press into the soft object. Keep your jaw and forehead relaxed. 4. Hold for __________ seconds. 5. Release the tension slowly. Relax your neck muscles completely before you repeat this exercise. Repeat __________ times. Complete this exercise __________ times a day.   Posture and body mechanics Body mechanics refers to the movements and positions of your body while you do your daily activities. Posture is part of body mechanics. Good posture and healthy body mechanics can help to relieve stress in your body's tissues and joints. Good posture means that your spine is in its natural S-curve position (your spine is neutral), your shoulders are pulled back slightly, and your head is not tipped forward. The following are general guidelines for applying improved posture and body mechanics to your everyday activities. Sitting 1. When sitting, keep your spine neutral and keep your feet flat on the floor. Use a footrest, if necessary, and keep your thighs parallel to  the floor. Avoid rounding your shoulders, and avoid tilting your head forward. 2. When working at a desk or a computer, keep your desk at a height where your hands are slightly lower than your elbows. Slide your chair under your desk so you are close enough to maintain good posture. 3. When working at a computer, place your monitor at a height where you are looking straight ahead and you do not have to tilt your head forward or downward to look at the screen.   Standing  When standing, keep your spine neutral and keep your feet about hip-width apart. Keep a slight bend in your knees. Your ears, shoulders, and hips should  line up.  When you do a task in which you stand in one place for a long time, place one foot up on a stable object that is 2-4 inches (5-10 cm) high, such as a footstool. This helps keep your spine neutral.   Resting When lying down and resting, avoid positions that are most painful for you. Try to support your neck in a neutral position. You can use a contour pillow or a small rolled-up towel. Your pillow should support your neck but not push on it. This information is not intended to replace advice given to you by your health care provider. Make sure you discuss any questions you have with your health care provider. Document Revised: 02/18/2019 Document Reviewed: 07/30/2018 Elsevier Patient Education  2021 [employer] Inc.  Neck Exercises Ask your health care provider which exercises are safe for you. Do exercises exactly as told by your health care provider and adjust them as directed. It is normal to feel mild stretching, pulling, tightness, or discomfort as you do these exercises. Stop right away if you feel sudden pain or your pain gets worse. Do not begin these exercises until told by your health care provider. Neck exercises can be important for many reasons. They can improve strength and maintain flexibility in your neck, which will help your upper back and prevent neck  pain. Stretching exercises Rotation neck stretching 5. Sit in a chair or stand up. 6. Place your feet flat on the floor, shoulder width apart. 7. Slowly turn your head (rotate) to the right until a slight stretch is felt. Turn it all the way to the right so you can look over your right shoulder. Do not tilt or tip your head. 8. Hold this position for 10-30 seconds. 9. Slowly turn your head (rotate) to the left until a slight stretch is felt. Turn it all the way to the left so you can look over your left shoulder. Do not tilt or tip your head. 10. Hold this position for 10-30 seconds. Repeat __________ times. Complete this exercise __________ times a day.   Neck retraction 5. Sit in a sturdy chair or stand up. 6. Look straight ahead. Do not bend your neck. 7. Use your fingers to push your chin backward (retraction). Do not bend your neck for this movement. Continue to face straight ahead. If you are doing the exercise properly, you will feel a slight sensation in your throat and a stretch at the back of your neck. 8. Hold the stretch for 1-2 seconds. Repeat __________ times. Complete this exercise __________ times a day. Strengthening exercises Neck press 6. Lie on your back on a firm bed or on the floor with a pillow under your head. 7. Use your neck muscles to push your head down on the pillow and straighten your spine. 8. Hold the position as well as you can. Keep your head facing up (in a neutral position) and your chin tucked. 9. Slowly count to 5 while holding this position. Repeat __________ times. Complete this exercise __________ times a day. Isometrics These are exercises in which you strengthen the muscles in your neck while keeping your neck still (isometrics). 5. Sit in a supportive chair and place your hand on your forehead. 6. Keep your head and face facing straight ahead. Do not flex or extend your neck while doing isometrics. 7. Push forward with your head and neck while  pushing back with your hand. Hold for 10 seconds. 8. Do the sequence again, this  time putting your hand against the back of your head. Use your head and neck to push backward against the hand pressure. 9. Finally, do the same exercise on either side of your head, pushing sideways against the pressure of your hand. Repeat __________ times. Complete this exercise __________ times a day. Prone head lifts 6. Lie face-down (prone position), resting on your elbows so that your chest and upper back are raised. 7. Start with your head facing downward, near your chest. Position your chin either on or near your chest. 8. Slowly lift your head upward. Lift until you are looking straight ahead. Then continue lifting your head as far back as you can comfortably stretch. 9. Hold your head up for 5 seconds. Then slowly lower it to your starting position. Repeat __________ times. Complete this exercise __________ times a day. Supine head lifts 4. Lie on your back (supine position), bending your knees to point to the ceiling and keeping your feet flat on the floor. 5. Lift your head slowly off the floor, raising your chin toward your chest. 6. Hold for 5 seconds. Repeat __________ times. Complete this exercise __________ times a day. Scapular retraction 1. Stand with your arms at your sides. Look straight ahead. 2. Slowly pull both shoulders (scapulae) backward and downward (retraction) until you feel a stretch between your shoulder blades in your upper back. 3. Hold for 10-30 seconds. 4. Relax and repeat. Repeat __________ times. Complete this exercise __________ times a day. Contact a health care provider if:  Your neck pain or discomfort gets much worse when you do an exercise.  Your neck pain or discomfort does not improve within 2 hours after you exercise. If you have any of these problems, stop exercising right away. Do not do the exercises again unless your health care provider says that you  can. Get help right away if:  You develop sudden, severe neck pain. If this happens, stop exercising right away. Do not do the exercises again unless your health care provider says that you can. This information is not intended to replace advice given to you by your health care provider. Make sure you discuss any questions you have with your health care provider. Document Revised: 08/27/2018 Document Reviewed: 08/27/2018 Elsevier Patient Education  2021 [employer].

## 2021-03-15 NOTE — Progress Notes (Signed)
Chief Complaint  Patient presents with  . Annual Exam   Annual  1. Neck pain left like strain or slept the wrong way nothing tried neck popping x 2 weeks 2. Reviewed labs elevated creatinine denies nsaids but did try keto diet bar 3. Obesity wants to lose wt likes at times carbs and sweets not exercising   Review of Systems  Constitutional: Negative for weight loss.  HENT: Negative for hearing loss.        +PND  Eyes: Negative for blurred vision.  Respiratory: Negative for shortness of breath.   Cardiovascular: Negative for chest pain.  Gastrointestinal: Negative for abdominal pain.  Musculoskeletal: Negative for joint pain and neck pain.  Skin: Negative for rash.  Neurological: Negative for headaches.  Psychiatric/Behavioral: Negative for depression.   Past Medical History:  Diagnosis Date  . Arthritis    left shoulder  . Cataract    ? which eye mild   . COVID-19    11/04/21 sneezing  . Diabetes mellitus without complication ([place])   . GERD (gastroesophageal reflux disease)   . Hypertension   . Migraines   . OSA on CPAP   . Prediabetes   . Sleep apnea   . UTI (urinary tract infection)    Past Surgical History:  Procedure Laterality Date  . ABDOMINAL HYSTERECTOMY     fibroids   Family History  Problem Relation Age of Onset  . Diabetes Father   . Stroke Father   . Seizures Father   . Heart disease Mother   . Diabetes Mother   . Other Sister        in 68s legionarres   . Cancer Maternal Grandmother        pancreatitic   . Other Brother        colon resected ?reason  . Breast cancer Neg Hx    Social History   Socioeconomic History  . Marital status: Legally Separated    Spouse name: Not on file  . Number of children: Not on file  . Years of education: Not on file  . Highest education level: Not on file  Occupational History  . Not on file  Tobacco Use  . Smoking status: Never Smoker  . Smokeless tobacco: Never Used  Substance and Sexual Activity  .  Alcohol use: Yes    Alcohol/week: 0.0 standard drinks    Comment: 1-2 per month  . Drug use: Not on file  . Sexual activity: Not on file  Other Topics Concern  . 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 amLODipine (NORVASC) 5 MG tablet Take 1 tablet (5 mg total) by mouth at bedtime.  . Calcium Carb-Cholecalciferol (CALTRATE 600+D3) 600-800 MG-UNIT TABS Take 1 tablet by mouth 2 (two) times daily with a meal.  . cyclobenzaprine (FLEXERIL) 5 MG tablet Take 1-2 tablets (5-10 mg total) by mouth at bedtime as needed for muscle spasms.  . Multiple  Vitamin (MULTIVITAMIN) capsule Take 1 capsule by mouth daily.  . [DISCONTINUED] hydrochlorothiazide (HYDRODIURIL) 25 MG tablet Take 1 tablet (25 mg total) by mouth daily. In am d/c benicar 20-12.5 mg daily   No Known Allergies Recent Results (from the past 2160 hour(s))  HIV antibody (with reflex)     Status: None   Collection Time: 03/13/21  7:56 AM  Result Value Ref Range   HIV 1&2 Ab, 4th Generation NON-REACTIVE NON-REACTIVE    Comment: HIV-1 antigen and HIV-1/HIV-2 antibodies were not detected. There is no laboratory evidence of HIV infection. Marland Kitchen PLEASE NOTE: This information has been disclosed to you from records whose confidentiality may be protected by state law.  If your state requires such protection, then the state law prohibits you from making any further disclosure of the information without the specific written consent of the person to whom it pertains, or as otherwise  permitted by law. A general authorization for the release of medical or other information is NOT sufficient for this purpose. . For additional information please refer to http://education.quest[employer].com/faq/FAQ106 (This link is being provided for informational/ educational purposes only.) . Marland Kitchen The performance of this assay has not been clinically validated in patients less than 29 years old. .   Vitamin D (25 hydroxy)     Status: None   Collection Time: 03/13/21  7:56 AM  Result Value Ref Range   VITD 45.07 30.00 - 100.00 ng/mL  Urinalysis, Routine w reflex microscopic     Status: Abnormal   Collection Time: 03/13/21  7:56 AM  Result Value Ref Range   Color, Urine YELLOW YELLOW   APPearance CLEAR CLEAR   Specific Gravity, Urine 1.015 1.001 - 1.035   pH 6.5 5.0 - 8.0   Glucose, UA NEGATIVE NEGATIVE   Bilirubin Urine NEGATIVE NEGATIVE   Ketones, ur NEGATIVE NEGATIVE   Hgb urine dipstick NEGATIVE NEGATIVE   Protein, ur NEGATIVE NEGATIVE   Nitrite NEGATIVE NEGATIVE   Leukocytes,Ua 2+ (A) NEGATIVE   WBC, UA NONE SEEN 0 - 5 /HPF   RBC / HPF NONE SEEN 0 - 2 /HPF   Squamous Epithelial / LPF 0-5 < OR = 5 /HPF   Bacteria, UA MODERATE (A) NONE SEEN /HPF   Hyaline Cast NONE SEEN NONE SEEN /LPF  TSH     Status: None   Collection Time: 03/13/21  7:56 AM  Result Value Ref Range   TSH 1.72 0.35 - 4.50 uIU/mL  Hemoglobin A1c     Status: None   Collection Time: 03/13/21  7:56 AM  Result Value Ref Range   Hgb A1c MFr Bld 6.4 4.6 - 6.5 %    Comment: Glycemic Control Guidelines for People with Diabetes:Non Diabetic:  <6%Goal of Therapy: <7%Additional Action Suggested:  >8%   CBC with Differential/Platelet     Status: None   Collection Time: 03/13/21  7:56 AM  Result Value Ref Range   WBC 7.5 4.0 - 10.5 K/uL   RBC 4.69 3.87 - 5.11 Mil/uL   Hemoglobin 13.9 12.0 - 15.0 g/dL   HCT 41.5 36.0 - 46.0 %   MCV 88.5 78.0 - 100.0 fl   MCHC 33.6 30.0 - 36.0 g/dL   RDW 12.9 11.5 - 15.5 %    Platelets 262.0 150.0 - 400.0 K/uL   Neutrophils Relative % 64.0 43.0 - 77.0 %   Lymphocytes Relative 25.7 12.0 - 46.0 %   Monocytes Relative 7.5 3.0 - 12.0 %   Eosinophils Relative 2.1 0.0 - 5.0 %  Basophils Relative 0.7 0.0 - 3.0 %   Neutro Abs 4.8 1.4 - 7.7 K/uL   Lymphs Abs 1.9 0.7 - 4.0 K/uL   Monocytes Absolute 0.6 0.1 - 1.0 K/uL   Eosinophils Absolute 0.2 0.0 - 0.7 K/uL   Basophils Absolute 0.0 0.0 - 0.1 K/uL  Lipid panel     Status: Abnormal   Collection Time: 03/13/21  7:56 AM  Result Value Ref Range   Cholesterol 202 (H) 0 - 200 mg/dL    Comment: ATP III Classification       Desirable:  < 200 mg/dL               Borderline High:  200 - 239 mg/dL          High:  > = 240 mg/dL   Triglycerides 135.0 0.0 - 149.0 mg/dL    Comment: Normal:  <150 mg/dLBorderline High:  150 - 199 mg/dL   HDL 43.20 >39.00 mg/dL   VLDL 27.0 0.0 - 40.0 mg/dL   LDL Cholesterol 131 (H) 0 - 99 mg/dL   Total CHOL/HDL Ratio 5     Comment:                Men          Women1/2 Average Risk     3.4          3.3Average Risk          5.0          4.42X Average Risk          9.6          7.13X Average Risk          15.0          11.0                       NonHDL 158.49     Comment: NOTE:  Non-HDL goal should be 30 mg/dL higher than patient's LDL goal (i.e. LDL goal of < 70 mg/dL, would have non-HDL goal of < 100 mg/dL)  Comprehensive metabolic panel     Status: Abnormal   Collection Time: 03/13/21  7:56 AM  Result Value Ref Range   Sodium 141 135 - 145 mEq/L   Potassium 3.8 3.5 - 5.1 mEq/L   Chloride 102 96 - 112 mEq/L   CO2 29 19 - 32 mEq/L   Glucose, Bld 118 (H) 70 - 99 mg/dL   BUN 16 6 - 23 mg/dL   Creatinine, Ser 1.02 0.40 - 1.20 mg/dL   Total Bilirubin 0.5 0.2 - 1.2 mg/dL   Alkaline Phosphatase 76 39 - 117 U/L   AST 17 0 - 37 U/L   ALT 17 0 - 35 U/L   Total Protein 7.2 6.0 - 8.3 g/dL   Albumin 4.5 3.5 - 5.2 g/dL   GFR 59.13 (L) >60.00 mL/min    Comment: Calculated using the CKD-EPI Creatinine  Equation (2021)   Calcium 10.0 8.4 - 10.5 mg/dL  MICROSCOPIC MESSAGE     Status: None   Collection Time: 03/13/21  7:56 AM  Result Value Ref Range   Note      Comment: This urine was analyzed for the presence of WBC,  RBC, bacteria, casts, and other formed elements.  Only those elements seen were reported. . .    Objective  Body mass index is 32.2 kg/m. Wt Readings from Last 3 Encounters:  03/15/21 194 lb 3.2 oz (88.1  kg)  03/11/20 190 lb (86.2 kg)  02/03/19 185 lb 3.2 oz (84 kg)   Temp Readings from Last 3 Encounters:  03/15/21 97.7 F (36.5 C) (Oral)  03/11/20 (!) 97 F (36.1 C) (Temporal)  02/03/19 97.9 F (36.6 C) (Oral)   BP Readings from Last 3 Encounters:  03/15/21 108/70  03/11/20 118/70  02/03/19 (!) 150/80   Pulse Readings from Last 3 Encounters:  03/15/21 88  03/11/20 87  02/03/19 70    Physical Exam Vitals and nursing note reviewed. Exam conducted with a chaperone present.  Constitutional:      Appearance: Normal appearance. She is well-developed and well-groomed. She is obese.  HENT:     Head: Normocephalic and atraumatic.  Cardiovascular:     Rate and Rhythm: Normal rate and regular rhythm.     Heart sounds: Normal heart sounds. No murmur heard.   Pulmonary:     Effort: Pulmonary effort is normal.     Breath sounds: Normal breath sounds.  Chest:     Chest wall: No mass.  Breasts: Breasts are symmetrical.     Right: Normal. No axillary adenopathy.     Left: Normal. No axillary adenopathy.    Abdominal:     General: Abdomen is flat. Bowel sounds are normal.     Tenderness: There is no abdominal tenderness.  Lymphadenopathy:     Upper Body:     Right upper body: No axillary adenopathy.     Left upper body: No axillary adenopathy.  Skin:    General: Skin is warm and dry.  Neurological:     General: No focal deficit present.     Mental Status: She is alert and oriented to person, place, and time. Mental status is at baseline.     Gait:  Gait normal.  Psychiatric:        Attention and Perception: Attention and perception normal.        Mood and Affect: Mood and affect normal.        Speech: Speech normal.        Behavior: Behavior normal. Behavior is cooperative.        Thought Content: Thought content normal.        Cognition and Memory: Cognition and memory normal.        Judgment: Judgment normal.     Assessment  Plan  Annual physical exam Declines flu shot  Tdap 10/03/15  Consider shingrix  Had J&J x 1 consider pfizer  Consider hep B,MMR in future    mammo 10/2020 negative ordered repeat  EGD 08/09/10 mild gastritis  Colonoscopy Dr. Tiffany Kocher 2015 get report 10/05/14 repeat due 10/05/24  S/p hysterectomy no h/o abnl had in 2012  DEXA 12/13/16 osteopenia rec calcium and vitamin HCV 09/30/17 negative [facility] seen 2022 right gland tear duct blocked as of 03/15/21 no appt yet  Dentist Dr. Lynelle Smoke  Iron skin on [place] call back if interested left breast SK removal  rec healthy diet and exercise   Cervicalgia - Plan: DG Cervical Spine Complete, cyclobenzaprine (FLEXERIL) 5 MG tablet Massage   Acute cystitis without hematuria - Plan: Urine Culture rule out  Essential hypertension controlled- Plan: hydrochlorothiazide (HYDRODIURIL) 12.5 MG tablet, Basic Metabolic Panel (BMET) Cont norvasc 5 mg qd monitor BP  Elevated serum creatinine - Plan: Basic Metabolic Panel (BMET) Hydration water 55-64 ounces daily   Obesity (BMI 30-39.9)  rec healthy diet and exercise    Provider: Dr. Olivia Mackie McLean-Scocuzza-[facility]

## 2021-03-16 DIAGNOSIS — N3000 Acute cystitis without hematuria: Secondary | ICD-10-CM | POA: Diagnosis not present

## 2021-03-16 NOTE — Addendum Note (Signed)
Addended by: Warden Fillers on: 03/16/2021 08:23 AM   Modules accepted: Orders

## 2021-03-19 ENCOUNTER — Encounter: Payer: Self-pay | Admitting: Internal Medicine

## 2021-03-20 ENCOUNTER — Other Ambulatory Visit: Payer: Self-pay | Admitting: Family

## 2021-03-20 DIAGNOSIS — R109 Unspecified abdominal pain: Secondary | ICD-10-CM

## 2021-03-20 MED ORDER — AMOXICILLIN-POT CLAVULANATE 875-125 MG PO TABS: 1.0000 | ORAL_TABLET | Freq: Two times a day (BID) | ORAL | 0 refills | Status: AC

## 2021-03-20 NOTE — Telephone Encounter (Signed)
Spoke with Patient and she denies all symptoms such as diarrhea, history of diverticulitis, or fever.   Patient states she is agreeable to Augmentin while waiting for the final results. Patient states she will also go to pick up a probiotic. Patient would like the antibiotic sent in today.

## 2021-03-20 NOTE — Telephone Encounter (Signed)
Left message to return call 

## 2021-03-20 NOTE — Telephone Encounter (Signed)
Patient returned office phone call. 

## 2021-03-22 ENCOUNTER — Telehealth: Payer: Self-pay | Admitting: Internal Medicine

## 2021-03-22 LAB — URINE CULTURE

## 2021-03-22 NOTE — Telephone Encounter (Signed)
Called and spoke with the Patient to give urine results. Patient states that last night she noticed 3 small pumps on the lips of her vagina. States that they are sensitive to the touch and the 2 smaller bumps are coming to a head, like pimples.   Patient states she has had boils before and thinks this may be the case. States that she is on antibiotic for UTI that may help if this is a boil as well. Wanting to know if there is anything topically she can do to help?   Please advise

## 2021-03-22 NOTE — Telephone Encounter (Signed)
If she can my chart picture of the area so I may see  She can try vasoline to sooth, oatmeal baths warm water   I wonder if it is a boil or blister if ulcerates then likely a blister which is why I want to see a photo please discrete photo

## 2021-03-23 NOTE — Telephone Encounter (Signed)
Called and informed the Patient of the below. She states she will try to take and send a picture on mychart tonight.

## 2021-03-26 ENCOUNTER — Encounter: Payer: Self-pay | Admitting: Internal Medicine

## 2021-03-31 ENCOUNTER — Other Ambulatory Visit (INDEPENDENT_AMBULATORY_CARE_PROVIDER_SITE_OTHER): Payer: BC Managed Care – PPO

## 2021-03-31 ENCOUNTER — Other Ambulatory Visit: Payer: Self-pay

## 2021-03-31 DIAGNOSIS — R7989 Other specified abnormal findings of blood chemistry: Secondary | ICD-10-CM | POA: Diagnosis not present

## 2021-03-31 DIAGNOSIS — I10 Essential (primary) hypertension: Secondary | ICD-10-CM | POA: Diagnosis not present

## 2021-03-31 LAB — BASIC METABOLIC PANEL
BUN: 15 mg/dL (ref 6–23)
CO2: 27 mEq/L (ref 19–32)
Calcium: 9.9 mg/dL (ref 8.4–10.5)
Chloride: 103 mEq/L (ref 96–112)
Creatinine, Ser: 0.93 mg/dL (ref 0.40–1.20)
GFR: 66.03 mL/min (ref >60.00)
Glucose, Bld: 109 mg/dL — ABNORMAL HIGH (ref 70–99)
Potassium: 3.5 mEq/L (ref 3.5–5.1)
Sodium: 140 mEq/L (ref 135–145)

## 2021-05-16 DIAGNOSIS — G4733 Obstructive sleep apnea (adult) (pediatric): Secondary | ICD-10-CM | POA: Diagnosis not present

## 2021-06-12 DIAGNOSIS — Z20828 Contact with and (suspected) exposure to other viral communicable diseases: Secondary | ICD-10-CM | POA: Diagnosis not present

## 2021-06-28 ENCOUNTER — Encounter: Payer: Self-pay | Admitting: Internal Medicine

## 2021-07-10 DIAGNOSIS — I10 Essential (primary) hypertension: Secondary | ICD-10-CM | POA: Diagnosis not present

## 2021-07-10 DIAGNOSIS — E1165 Type 2 diabetes mellitus with hyperglycemia: Secondary | ICD-10-CM | POA: Diagnosis not present

## 2021-07-10 DIAGNOSIS — E785 Hyperlipidemia, unspecified: Secondary | ICD-10-CM | POA: Diagnosis not present

## 2021-07-10 DIAGNOSIS — Z724 Inappropriate diet and eating habits: Secondary | ICD-10-CM | POA: Diagnosis not present

## 2021-07-24 DIAGNOSIS — I10 Essential (primary) hypertension: Secondary | ICD-10-CM | POA: Diagnosis not present

## 2021-07-24 DIAGNOSIS — E1165 Type 2 diabetes mellitus with hyperglycemia: Secondary | ICD-10-CM | POA: Diagnosis not present

## 2021-07-24 DIAGNOSIS — E785 Hyperlipidemia, unspecified: Secondary | ICD-10-CM | POA: Diagnosis not present

## 2021-07-24 DIAGNOSIS — Z724 Inappropriate diet and eating habits: Secondary | ICD-10-CM | POA: Diagnosis not present

## 2021-07-24 DIAGNOSIS — Z20822 Contact with and (suspected) exposure to covid-19: Secondary | ICD-10-CM | POA: Diagnosis not present

## 2021-08-01 DIAGNOSIS — Z20822 Contact with and (suspected) exposure to covid-19: Secondary | ICD-10-CM | POA: Diagnosis not present

## 2021-08-18 DIAGNOSIS — E785 Hyperlipidemia, unspecified: Secondary | ICD-10-CM | POA: Diagnosis not present

## 2021-08-18 DIAGNOSIS — I10 Essential (primary) hypertension: Secondary | ICD-10-CM | POA: Diagnosis not present

## 2021-08-18 DIAGNOSIS — E1165 Type 2 diabetes mellitus with hyperglycemia: Secondary | ICD-10-CM | POA: Diagnosis not present

## 2021-08-18 DIAGNOSIS — Z724 Inappropriate diet and eating habits: Secondary | ICD-10-CM | POA: Diagnosis not present

## 2021-09-06 ENCOUNTER — Telehealth (INDEPENDENT_AMBULATORY_CARE_PROVIDER_SITE_OTHER): Payer: BC Managed Care – PPO | Admitting: Adult Health

## 2021-09-06 ENCOUNTER — Telehealth: Payer: Self-pay

## 2021-09-06 ENCOUNTER — Encounter: Payer: Self-pay | Admitting: Adult Health

## 2021-09-06 ENCOUNTER — Encounter: Payer: Self-pay | Admitting: Internal Medicine

## 2021-09-06 VITALS — Ht 65.12 in | Wt 194.0 lb

## 2021-09-06 DIAGNOSIS — J988 Other specified respiratory disorders: Secondary | ICD-10-CM | POA: Diagnosis not present

## 2021-09-06 MED ORDER — PREDNISONE 10 MG PO TABS: 10.0000 mg | ORAL_TABLET | Freq: Every day | ORAL | 0 refills | Status: DC

## 2021-09-06 MED ORDER — DOXYCYCLINE HYCLATE 100 MG PO CAPS: 100.0000 mg | ORAL_CAPSULE | Freq: Two times a day (BID) | ORAL | 0 refills | Status: DC

## 2021-09-06 NOTE — Telephone Encounter (Signed)
Called and scheduled the Patient a video visit at 4:00

## 2021-09-06 NOTE — Telephone Encounter (Signed)
On Oct 10 I woke up with severe sore throat.  2 days later started having cold symptoms also, sneezing, coughing, congestion and slight pressure in right ear.   I stayed on Coricidin during this time.  I took 2 inhome Covid test, both negative.    I felt good but had these symptoms, no fever or chills.   Lost my voice on 10/13 until 10/16. Meanwhile the symptoms all left by 10/17 except congestion in my chest and nose and occasional coughing and sneezing.  I've never had a cold linger this long so I started on 10/21 with Claritin D but this congestion lingers.  Yesterday, 10/25 I had one violent coughing spell felt like I would throw up.   I need relief.  What can you prescribe or do you need to see me?

## 2021-09-06 NOTE — Progress Notes (Signed)
Virtual Visit via Video Note  I connected with Nina Bryant on 09/06/21 at  4:00 PM EDT by a video enabled telemedicine application and verified that I am speaking with the correct person using two identifiers.  Location patient: home Location provider:work or home office Persons participating in the virtual visit: patient, provider  I discussed the limitations of evaluation and management by telemedicine and the availability of in person appointments. The patient expressed understanding and agreed to proceed.   HPI: 60 year old female who is being evaluated today for an acute issue.  Her symptoms have been waxing and waning for the last 16 days.  Symptoms include nasal congestion similar productive cough with yellow sputum, chest congestion, loss of voice( resolved) and right ear pain ( resolved).   She has not had any fevers or chills.  She took to cook at home COVID test, each were negative.  At home was using Coricidin cold and flu without relief and then switched to Claritin and Mucinex without relief.   ROS: See pertinent positives and negatives per HPI.  Past Medical History:  Diagnosis Date   Arthritis    left shoulder   Cataract    ? which eye mild    COVID-19    11/04/21 sneezing   Diabetes mellitus without complication ([facility])    GERD (gastroesophageal reflux disease)    Hypertension    Migraines    OSA on CPAP    Prediabetes    Sleep apnea    UTI (urinary tract infection)     Past Surgical History:  Procedure Laterality Date   ABDOMINAL HYSTERECTOMY     fibroids    Family History  Problem Relation Age of Onset   Diabetes Father    Stroke Father    Seizures Father    Heart disease Mother    Diabetes Mother    Other Sister        in 2s legionarres    Cancer Maternal Grandmother        pancreatitic    Other Brother        colon resected ?reason   Breast cancer Neg Hx        Current Outpatient Medications:    amLODipine (NORVASC) 5 MG tablet,  Take 1 tablet (5 mg total) by mouth at bedtime., Disp: 90 tablet, Rfl: 3   Calcium Carb-Cholecalciferol (CALTRATE 600+D3) 600-800 MG-UNIT TABS, Take 1 tablet by mouth 2 (two) times daily with a meal., Disp: 180 tablet, Rfl: 3   cyclobenzaprine (FLEXERIL) 5 MG tablet, Take 1-2 tablets (5-10 mg total) by mouth at bedtime as needed for muscle spasms., Disp: 30 tablet, Rfl: 2   doxycycline (VIBRAMYCIN) 100 MG capsule, Take 1 capsule (100 mg total) by mouth 2 (two) times daily., Disp: 14 capsule, Rfl: 0   hydrochlorothiazide (HYDRODIURIL) 25 MG tablet, Take 0.5 tablets (12.5 mg total) by mouth daily. Dc 25 mg, Disp: 45 tablet, Rfl: 3   Multiple Vitamin (MULTIVITAMIN) capsule, Take 1 capsule by mouth daily., Disp: , Rfl:    predniSONE (DELTASONE) 10 MG tablet, Take 1 tablet (10 mg total) by mouth daily with breakfast., Disp: 5 tablet, Rfl: 0  EXAM:  VITALS per patient if applicable:  GENERAL: alert, oriented, appears well and in no acute distress  HEENT: atraumatic, conjunttiva clear, no obvious abnormalities on inspection of external nose and ears  NECK: normal movements of the head and neck  LUNGS: on inspection no signs of respiratory distress, breathing rate appears normal, no obvious gross SOB,  gasping or wheezing  CV: no obvious cyanosis  MS: moves all visible extremities without noticeable abnormality  PSYCH/NEURO: pleasant and cooperative, no obvious depression or anxiety, speech and thought processing grossly intact  ASSESSMENT AND PLAN:  Discussed the following assessment and plan:  1. Respiratory infection -Due to symptoms and length of symptoms will treat with doxycycline and prednisone.  Advised follow-up if no improvement in the next week or sooner if symptoms worsen. - doxycycline (VIBRAMYCIN) 100 MG capsule; Take 1 capsule (100 mg total) by mouth 2 (two) times daily.  Dispense: 14 capsule; Refill: 0 - predniSONE (DELTASONE) 10 MG tablet; Take 1 tablet (10 mg total) by mouth  daily with breakfast.  Dispense: 5 tablet; Refill: 0     I discussed the assessment and treatment plan with the patient. The patient was provided an opportunity to ask questions and all were answered. The patient agreed with the plan and demonstrated an understanding of the instructions.   The patient was advised to call back or seek an in-person evaluation if the symptoms worsen or if the condition fails to improve as anticipated.   Shirline Frees, NP

## 2021-09-12 DIAGNOSIS — Z724 Inappropriate diet and eating habits: Secondary | ICD-10-CM | POA: Diagnosis not present

## 2021-09-12 DIAGNOSIS — E785 Hyperlipidemia, unspecified: Secondary | ICD-10-CM | POA: Diagnosis not present

## 2021-09-12 DIAGNOSIS — I10 Essential (primary) hypertension: Secondary | ICD-10-CM | POA: Diagnosis not present

## 2021-09-12 DIAGNOSIS — E1165 Type 2 diabetes mellitus with hyperglycemia: Secondary | ICD-10-CM | POA: Diagnosis not present

## 2021-09-15 ENCOUNTER — Encounter: Payer: Self-pay | Admitting: Internal Medicine

## 2021-09-15 ENCOUNTER — Telehealth: Payer: Self-pay | Admitting: Internal Medicine

## 2021-09-15 ENCOUNTER — Ambulatory Visit (INDEPENDENT_AMBULATORY_CARE_PROVIDER_SITE_OTHER): Payer: BC Managed Care – PPO | Admitting: Internal Medicine

## 2021-09-15 ENCOUNTER — Other Ambulatory Visit: Payer: Self-pay

## 2021-09-15 VITALS — BP 126/78 | HR 92 | Temp 98.0°F | Ht 65.12 in | Wt 193.2 lb

## 2021-09-15 DIAGNOSIS — E2839 Other primary ovarian failure: Secondary | ICD-10-CM

## 2021-09-15 DIAGNOSIS — Z9989 Dependence on other enabling machines and devices: Secondary | ICD-10-CM | POA: Diagnosis not present

## 2021-09-15 DIAGNOSIS — R7303 Prediabetes: Secondary | ICD-10-CM

## 2021-09-15 DIAGNOSIS — I10 Essential (primary) hypertension: Secondary | ICD-10-CM | POA: Diagnosis not present

## 2021-09-15 DIAGNOSIS — E785 Hyperlipidemia, unspecified: Secondary | ICD-10-CM | POA: Diagnosis not present

## 2021-09-15 DIAGNOSIS — R0981 Nasal congestion: Secondary | ICD-10-CM

## 2021-09-15 DIAGNOSIS — M8580 Other specified disorders of bone density and structure, unspecified site: Secondary | ICD-10-CM

## 2021-09-15 DIAGNOSIS — N3000 Acute cystitis without hematuria: Secondary | ICD-10-CM

## 2021-09-15 DIAGNOSIS — J029 Acute pharyngitis, unspecified: Secondary | ICD-10-CM

## 2021-09-15 DIAGNOSIS — G4733 Obstructive sleep apnea (adult) (pediatric): Secondary | ICD-10-CM | POA: Diagnosis not present

## 2021-09-15 LAB — CBC WITH DIFFERENTIAL/PLATELET
Basophils Absolute: 0.1 10*3/uL (ref 0.0–0.1)
Basophils Relative: 1.2 % (ref 0.0–3.0)
Eosinophils Absolute: 0.2 10*3/uL (ref 0.0–0.7)
Eosinophils Relative: 2.7 % (ref 0.0–5.0)
HCT: 41.4 % (ref 36.0–46.0)
Hemoglobin: 13.6 g/dL (ref 12.0–15.0)
Lymphocytes Relative: 30.6 % (ref 12.0–46.0)
Lymphs Abs: 2.1 10*3/uL (ref 0.7–4.0)
MCHC: 32.8 g/dL (ref 30.0–36.0)
MCV: 89.4 fl (ref 78.0–100.0)
Monocytes Absolute: 0.6 10*3/uL (ref 0.1–1.0)
Monocytes Relative: 8.5 % (ref 3.0–12.0)
Neutro Abs: 4 10*3/uL (ref 1.4–7.7)
Neutrophils Relative %: 57 % (ref 43.0–77.0)
Platelets: 293 10*3/uL (ref 150.0–400.0)
RBC: 4.63 Mil/uL (ref 3.87–5.11)
RDW: 13.6 % (ref 11.5–15.5)
WBC: 7 10*3/uL (ref 4.0–10.5)

## 2021-09-15 LAB — COMPREHENSIVE METABOLIC PANEL
ALT: 14 U/L (ref 0–35)
AST: 19 U/L (ref 0–37)
Albumin: 4.6 g/dL (ref 3.5–5.2)
Alkaline Phosphatase: 72 U/L (ref 39–117)
BUN: 19 mg/dL (ref 6–23)
CO2: 28 mEq/L (ref 19–32)
Calcium: 10 mg/dL (ref 8.4–10.5)
Chloride: 106 mEq/L (ref 96–112)
Creatinine, Ser: 1.2 mg/dL (ref 0.40–1.20)
GFR: 48.48 mL/min — ABNORMAL LOW (ref >60.00)
Glucose, Bld: 102 mg/dL — ABNORMAL HIGH (ref 70–99)
Potassium: 4.4 mEq/L (ref 3.5–5.1)
Sodium: 139 mEq/L (ref 135–145)
Total Bilirubin: 0.5 mg/dL (ref 0.2–1.2)
Total Protein: 7.6 g/dL (ref 6.0–8.3)

## 2021-09-15 LAB — LIPID PANEL
Cholesterol: 218 mg/dL — ABNORMAL HIGH (ref 0–200)
HDL: 54.2 mg/dL (ref >39.00)
LDL Cholesterol: 139 mg/dL — ABNORMAL HIGH (ref 0–99)
NonHDL: 163.73
Total CHOL/HDL Ratio: 4
Triglycerides: 125 mg/dL (ref 0.0–149.0)
VLDL: 25 mg/dL (ref 0.0–40.0)

## 2021-09-15 LAB — HEMOGLOBIN A1C: Hgb A1c MFr Bld: 6.4 % (ref 4.6–6.5)

## 2021-09-15 NOTE — Patient Instructions (Addendum)
Mucinex dm or robitussin dm  Nasal saline  Flonase  Claritin, allegra, zyrtec, xyzal-at night Cepacol or chloroseptic spray for sore throat  Call sch 11/01/21   [phone] [facility]   [address]   [address]   [place] [address]     Zoster Vaccine, Recombinant injection x 2 doses 2nd dose <2-6 months  What is this medication? ZOSTER VACCINE (ZOS ter vak SEEN) is a vaccine used to reduce the risk of getting shingles. This vaccine is not used to treat shingles or nerve pain from shingles. This medicine may be used for other purposes; ask your health care provider or pharmacist if you have questions. COMMON BRAND NAME(S): [facility] What should I tell my care team before I take this medication? They need to know if you have any of these conditions: cancer immune system problems an unusual or allergic reaction to Zoster vaccine, other medications, foods, dyes, or preservatives pregnant or trying to get pregnant breast-feeding How should I use this medication? This vaccine is injected into a muscle. It is given by a health care provider. A copy of Vaccine Information Statements will be given before each vaccination. Be sure to read this information carefully each time. This sheet may change often. Talk to your health care provider about the use of this vaccine in children. This vaccine is not approved for use in children. Overdosage: If you think you have taken too much of this medicine contact a poison control center or emergency room at once. NOTE: This medicine is only for you. Do not share this medicine with others. What if I miss a dose? Keep appointments for follow-up (booster) doses. It is important not to miss your dose. Call your health care provider if you are unable to keep an appointment. What may interact with this medication? medicines that suppress your immune system medicines to treat cancer steroid medicines like prednisone or cortisone This list may not  describe all possible interactions. Give your health care provider a list of all the medicines, herbs, non-prescription drugs, or dietary supplements you use. Also tell them if you smoke, drink alcohol, or use illegal drugs. Some items may interact with your medicine. What should I watch for while using this medication? Visit your health care provider regularly. This vaccine, like all vaccines, may not fully protect everyone. What side effects may I notice from receiving this medication? Side effects that you should report to your doctor or health care professional as soon as possible: allergic reactions (skin rash, itching or hives; swelling of the face, lips, or tongue) trouble breathing Side effects that usually do not require medical attention (report these to your doctor or health care professional if they continue or are bothersome): chills headache fever nausea pain, redness, or irritation at site where injected tiredness vomiting This list may not describe all possible side effects. Call your doctor for medical advice about side effects. You may report side effects to FDA at 1-800-FDA-1088. Where should I keep my medication? This vaccine is only given by a health care provider. It will not be stored at home. NOTE: This sheet is a summary. It may not cover all possible information. If you have questions about this medicine, talk to your doctor, pharmacist, or health care provider.  2022 Elsevier/Gold Standard (2021-07-18 00:00:00)  Sore Throat A sore throat is pain, burning, irritation, or scratchiness in the throat. When you have a sore throat, you may feel pain or tenderness in your throat when you swallow  or talk. Many things can cause a sore throat, including: An infection. Seasonal allergies. Dryness in the air. Irritants, such as smoke or pollution. Radiation treatment for cancer. Gastroesophageal reflux disease (GERD). A tumor. A sore throat is often the first sign of  another sickness. It may happen with other symptoms, such as coughing, sneezing, fever, and swollen neck glands. Most sore throats go away without medical treatment. Follow these instructions at home:   Medicines Take over-the-counter and prescription medicines only as told by your health care provider. Children often get sore throats. Do not give your child aspirin because of the association with Reye's syndrome. Use throat sprays to soothe your throat as told by your health care provider. Managing pain To help with pain, try: Sipping warm liquids, such as broth, herbal tea, or warm water. Eating or drinking cold or frozen liquids, such as frozen ice pops. Gargling with a mixture of salt and water 3-4 times a day or as needed. To make salt water, completely dissolve -1 tsp (3-6 g) of salt in 1 cup (237 mL) of warm water. Sucking on hard candy or throat lozenges. Putting a cool-mist humidifier in your bedroom at night to moisten the air. Sitting in the bathroom with the door closed for 5-10 minutes while you run hot water in the shower. General instructions Do not use any products that contain nicotine or tobacco. These products include cigarettes, chewing tobacco, and vaping devices, such as e-cigarettes. If you need help quitting, ask your health care provider. Rest as needed. Drink enough fluid to keep your urine pale yellow. Wash your hands often with soap and water for at least 20 seconds. If soap and water are not available, use hand sanitizer. Contact a health care provider if: You have a fever for more than 2-3 days. You have symptoms that last for more than 2-3 days. Your throat does not get better within 7 days. You have a fever and your symptoms suddenly get worse. Get help right away if: You have difficulty breathing. You cannot swallow fluids, soft foods, or your saliva. You have increased swelling in your throat or neck. You have persistent nausea and vomiting. These  symptoms may represent a serious problem that is an emergency. Do not wait to see if the symptoms will go away. Get medical help right away. Call your local emergency services (911 in the [place] Do not drive yourself to the hospital. Summary A sore throat is pain, burning, irritation, or scratchiness in the throat. Many things can cause a sore throat. Take over-the-counter medicines only as told by your health care provider. Rest as needed. Drink enough fluid to keep your urine pale yellow. Contact a health care provider if your throat does not get better within 7 days. This information is not intended to replace advice given to you by your health care provider. Make sure you discuss any questions you have with your health care provider. Document Revised: 01/25/2021 Document Reviewed: 01/25/2021 Elsevier Patient Education  2022 Elsevier In

## 2021-09-15 NOTE — Progress Notes (Signed)
Chief Complaint  Patient presents with   Follow-up   F/u 1. Htn controlled on norvasc 5 mg and hctz 12.5 mg qd 2. Prediabetes check A1c today  3. Sore throat 08/21/21 tried coricidan hoarse 3rd day sneezing and cough  like uti no fever congestion getting worse had 09/06/21 visit given doxy sxs resolved and yesterday sxs restarted and nasal congestion and thinks related to cpap machine   Review of Systems  Constitutional:  Negative for weight loss.  HENT:  Positive for congestion, ear pain and sore throat.   Eyes:  Negative for blurred vision.  Respiratory:  Negative for shortness of breath.   Cardiovascular:  Negative for chest pain.  Gastrointestinal:  Negative for heartburn.  Musculoskeletal:  Negative for falls and joint pain.  Skin:  Negative for rash.  Neurological:  Negative for headaches.  Psychiatric/Behavioral:  Negative for depression.   Past Medical History:  Diagnosis Date   Arthritis    left shoulder   Cataract    ? which eye mild    COVID-19    11/04/21 sneezing   Diabetes mellitus without complication (HCC)    GERD (gastroesophageal reflux disease)    Hypertension    Migraines    OSA on CPAP    Prediabetes    Sleep apnea    UTI (urinary tract infection)    Past Surgical History:  Procedure Laterality Date   ABDOMINAL HYSTERECTOMY     fibroids   Family History  Problem Relation Age of Onset   Diabetes Father    Stroke Father    Seizures Father    Heart disease Mother    Diabetes Mother    Other Sister        in 61s legionarres    Cancer Maternal Grandmother        pancreatitic    Other Brother        colon resected ?reason   Breast cancer Neg Hx    Social History   Socioeconomic History   Marital status: Legally Separated    Spouse name: Not on file   Number of children: Not on file   Years of education: Not on file   Highest education level: Not on file  Occupational History   Not on file  Tobacco Use   Smoking status: Never    Smokeless tobacco: Never  Substance and Sexual Activity   Alcohol use: Yes    Alcohol/week: 0.0 standard drinks    Comment: 1-2 per month   Drug use: Not on file   Sexual activity: Not on file  Other Topics Concern   Not on file  Social History Narrative   Separated as of 2 months 11/2018    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   Calcium Carb-Cholecalciferol (CALTRATE 600+D3) 600-800 MG-UNIT TABS Take 1 tablet by mouth 2 (two) times daily with a meal.   hydrochlorothiazide (HYDRODIURIL) 25 MG tablet Take 0.5 tablets (12.5  mg total) by mouth daily. Dc 25 mg   Multiple Vitamin (MULTIVITAMIN) capsule Take 1 capsule by mouth daily.   No Known Allergies No results found for this or any previous visit (from the past 2160 hour(s)). Objective  Body mass index is 32.03 kg/m. Wt Readings from Last 3 Encounters:  09/15/21 193 lb 3.2 oz (87.6 kg)  09/06/21 194 lb (88 kg)  03/15/21 194 lb 3.2 oz (88.1 kg)   Temp Readings from Last 3 Encounters:  09/15/21 98 F (36.7 C) (Oral)  03/15/21 97.7 F (36.5 C) (Oral)  03/11/20 (!) 97 F (36.1 C) (Temporal)   BP Readings from Last 3 Encounters:  09/15/21 126/78  03/15/21 108/70  03/11/20 118/70   Pulse Readings from Last 3 Encounters:  09/15/21 92  03/15/21 88  03/11/20 87    Physical Exam Vitals and nursing note reviewed.  Constitutional:      Appearance: Normal appearance. She is well-developed and well-groomed. She is obese.  HENT:     Head: Normocephalic and  atraumatic.  Eyes:     Conjunctiva/sclera: Conjunctivae normal.     Pupils: Pupils are equal, round, and reactive to light.  Cardiovascular:     Rate and Rhythm: Normal rate and regular rhythm.     Heart sounds: Normal heart sounds. No murmur heard. Pulmonary:     Effort: Pulmonary effort is normal.     Breath sounds: Normal breath sounds.  Skin:    General: Skin is warm and dry.  Neurological:     General: No focal deficit present.     Mental Status: She is alert and oriented to person, place, and time. Mental status is at baseline.     Gait: Gait normal.  Psychiatric:        Attention and Perception: Attention and perception normal.        Mood and Affect: Mood and affect normal.        Speech: Speech normal.        Behavior: Behavior normal. Behavior is cooperative.        Thought Content: Thought content normal.        Cognition and Memory: Cognition and memory normal.        Judgment: Judgment normal.    Assessment  Plan  OSA on CPAP - Plan: Ambulatory referral to Pulmonology reassess cpap and nasal congestion/sore throat assoc with use   Hyperlipidemia, unspecified hyperlipidemia type - Plan: Lipid panel  Hypertension, unspecified type - Plan: Comprehensive metabolic panel, CBC with Differential/Platelet Controlled norvasc 5 mg and hctz 12.5 mg qd   Prediabetes - Plan: Hemoglobin A1c  Acute cystitis without hematuria - Plan: Urine Culture  Sore throat Nasal congestion  Consider ent in future Mucinex dm or robitussin dm  Nasal saline  Flonase  Claritin, allegra, zyrtec, xyzal-at night Cepacol or chloroseptic spray for sore throat   HM Declines flu shot  Tdap 10/03/15  Consider shingrix will call back for this vaccine Had J&J x 1 consider pfizer  Consider prevnar declines Consider hep B,MMR in future     mammo 10/2020 negative ordered repeat  EGD 08/09/10 mild gastritis  Colonoscopy Dr. Tiffany Kocher 2015 get report 10/05/14 repeat due 10/05/24  S/p  hysterectomy no h/o abnl had in 2012  DEXA 12/13/16 osteopenia rec calcium and vitamin  Ordered  HCV 09/30/17 negative  [facility] seen 2022 right gland tear duct blocked as of 03/15/21 no appt yet  Dentist Dr. Lynelle Smoke  [facility] on [address] call back if interested  left breast SK removal  rec healthy diet and exercise    Provider: Dr. Olivia Mackie McLean-Scocuzza-Internal Medicine

## 2021-09-15 NOTE — Telephone Encounter (Signed)
Pt was seen today 09/15/21 at 8AM. Pt discussed shingles vaccine with provider and was advised in the notes to call when ready for the shingles vaccine. Pt wanted to go ahead and set her appt for the vaccine. Scheduled her for 09/29/21 at 8:45AM. Sending back message to make sure the order is put in for this visit.

## 2021-09-15 NOTE — Telephone Encounter (Signed)
Per health maintenance note in Patient's visit today form Dr Olivia Mackie McLean-Scocuzza:   HM Declines flu shot  Tdap 10/03/15  Consider shingrix will call back for this vaccine Had J&J x 1 consider pfizer  Consider prevnar declines Consider hep B,MMR in future.   Okay to do shingles vaccine

## 2021-09-16 LAB — URINE CULTURE
MICRO NUMBER:: 12595890
Result:: NO GROWTH
SPECIMEN QUALITY:: ADEQUATE

## 2021-09-18 ENCOUNTER — Other Ambulatory Visit: Payer: Self-pay | Admitting: Internal Medicine

## 2021-09-18 DIAGNOSIS — N179 Acute kidney failure, unspecified: Secondary | ICD-10-CM

## 2021-09-20 ENCOUNTER — Telehealth: Payer: Self-pay | Admitting: Pulmonary Disease

## 2021-09-20 NOTE — Telephone Encounter (Signed)
Patient scheduled sleep consult with Dr. Vassie Loll 10/02/21.  Per referral, Patient is on cpap and needs new sleep study. Patient had sleep study 04/2016 ordered by Dr. Leotis Shames,  [facility]. I reached out to [facility] to request original sleep study results.  Dr. Leotis Shames is no longer with [facility] or [facility].  Call was transferred and I have requested sleep study results from [facility] to be faxed to main office fax.

## 2021-09-25 DIAGNOSIS — Z724 Inappropriate diet and eating habits: Secondary | ICD-10-CM | POA: Diagnosis not present

## 2021-09-25 DIAGNOSIS — E785 Hyperlipidemia, unspecified: Secondary | ICD-10-CM | POA: Diagnosis not present

## 2021-09-25 DIAGNOSIS — I10 Essential (primary) hypertension: Secondary | ICD-10-CM | POA: Diagnosis not present

## 2021-09-25 DIAGNOSIS — E1165 Type 2 diabetes mellitus with hyperglycemia: Secondary | ICD-10-CM | POA: Diagnosis not present

## 2021-09-28 ENCOUNTER — Encounter: Payer: Self-pay | Admitting: Internal Medicine

## 2021-09-29 ENCOUNTER — Other Ambulatory Visit: Payer: Self-pay

## 2021-09-29 ENCOUNTER — Ambulatory Visit (INDEPENDENT_AMBULATORY_CARE_PROVIDER_SITE_OTHER): Payer: BC Managed Care – PPO

## 2021-09-29 DIAGNOSIS — Z724 Inappropriate diet and eating habits: Secondary | ICD-10-CM | POA: Diagnosis not present

## 2021-09-29 DIAGNOSIS — Z23 Encounter for immunization: Secondary | ICD-10-CM | POA: Diagnosis not present

## 2021-09-29 DIAGNOSIS — E785 Hyperlipidemia, unspecified: Secondary | ICD-10-CM | POA: Diagnosis not present

## 2021-09-29 DIAGNOSIS — N179 Acute kidney failure, unspecified: Secondary | ICD-10-CM

## 2021-09-29 LAB — BASIC METABOLIC PANEL
BUN: 10 mg/dL (ref 6–23)
CO2: 27 mEq/L (ref 19–32)
Calcium: 9.5 mg/dL (ref 8.4–10.5)
Chloride: 104 mEq/L (ref 96–112)
Creatinine, Ser: 0.85 mg/dL (ref 0.40–1.20)
GFR: 73.3 mL/min (ref >60.00)
Glucose, Bld: 101 mg/dL — ABNORMAL HIGH (ref 70–99)
Potassium: 4 mEq/L (ref 3.5–5.1)
Sodium: 139 mEq/L (ref 135–145)

## 2021-09-29 NOTE — Addendum Note (Signed)
Addended by: Warden Fillers on: 09/29/2021 08:45 AM   Modules accepted: Orders

## 2021-09-29 NOTE — Progress Notes (Signed)
Pt came in today for 1st of 2 dose Shingrix vaccine given in left deltoid IM. Patient tolerated well with no signs of distress.

## 2021-10-02 ENCOUNTER — Other Ambulatory Visit: Payer: Self-pay

## 2021-10-02 ENCOUNTER — Encounter: Payer: Self-pay | Admitting: Pulmonary Disease

## 2021-10-02 ENCOUNTER — Telehealth: Payer: Self-pay

## 2021-10-02 ENCOUNTER — Encounter: Payer: Self-pay | Admitting: Internal Medicine

## 2021-10-02 ENCOUNTER — Ambulatory Visit (INDEPENDENT_AMBULATORY_CARE_PROVIDER_SITE_OTHER): Payer: BC Managed Care – PPO | Admitting: Pulmonary Disease

## 2021-10-02 ENCOUNTER — Other Ambulatory Visit: Payer: Self-pay | Admitting: Internal Medicine

## 2021-10-02 VITALS — BP 132/70 | HR 92 | Temp 98.1°F | Ht 65.0 in | Wt 195.4 lb

## 2021-10-02 DIAGNOSIS — Z9989 Dependence on other enabling machines and devices: Secondary | ICD-10-CM | POA: Diagnosis not present

## 2021-10-02 DIAGNOSIS — G4733 Obstructive sleep apnea (adult) (pediatric): Secondary | ICD-10-CM | POA: Diagnosis not present

## 2021-10-02 NOTE — Progress Notes (Signed)
Ok to continue norvasc 5 mg daily for BP  Continue to monitor BP  Stop HCTZ  Kidney function greatly improved which is what we want

## 2021-10-02 NOTE — Telephone Encounter (Signed)
LMTCB in regards to lab results.  

## 2021-10-02 NOTE — Patient Instructions (Signed)
Schedule HST Try to track down old study Meantime, continue on your CPAP

## 2021-10-02 NOTE — Telephone Encounter (Signed)
Pt returning call

## 2021-10-02 NOTE — Assessment & Plan Note (Signed)
We will try to track down her old home sleep test from 2017 from [place] [facility]. We will reassess her at the current time with a home sleep test. She does seem to have excessive sleepiness, unfortunately CPAP has not impacted this much.  She has a reasonable cardiovascular profile but does have hypertension and age as her main risk factors.  If she ends up having only mild disease noticed corrected by positional therapy, then we can stop CPAP therapy however if she has anything more than mild disease and I would recommend to continue CPAP therapy   The pathophysiology of obstructive sleep apnea , it's cardiovascular consequences & modes of treatment including CPAP were discused with the patient in detail & they evidenced understanding. Also discussed alternative options including oral appliance and hypoglossal nerve stimulation technique

## 2021-10-02 NOTE — Progress Notes (Signed)
Subjective:    Patient ID: Nina Bryant, female    DOB: January 07, 1961, 60 y.o.   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  She was evaluated at [facility] and underwent home sleep study 04/2016 and since then was placed on auto CPAP.  She settled down with nasal pillows and then use this religiously for 5 years but experienced no benefit in terms of improvement in her energy levels.  She developed problems with her humidity and used a so clean machine.  She had a bacterial infection a few weeks ago and stopped using her machine for 2 weeks.  She has now started reusing and this but wonders if there are other alternatives and with her CPAP is absolutely necessary for her.  Epworth Sleepiness Scale is 11. She reports sleepiness while sitting and reading, watching TV, as a passenger in a car lying down to rest in the afternoons. Bedtime is between 10 and 11 PM, sleep latency is about 15 minutes, she sleeps on her right side with 2 pillows, reports 3-4 nocturnal awakenings mainly due to hot flashes and other postmenopausal symptoms and is out of bed at 6 AM feeling rested with occasional dryness of mouth but denies headaches. She has gained 20 pounds in the last few years. There is no history suggestive of cataplexy, sleep paralysis or parasomnias   I reviewed CPAP data on her my application on her phone which shows good control of events and excellent compliance over the last year with minimal leak  PMH -hypertension, diet-controlled diabetes  Past Medical History:  Diagnosis Date   Arthritis    left shoulder   Cataract    ? which eye mild    COVID-19    11/04/21 sneezing   Diabetes mellitus without complication (HCC)    GERD (gastroesophageal reflux disease)    Hypertension    Migraines    OSA on CPAP    Prediabetes    Sleep apnea    UTI (urinary tract infection)    Past Surgical History:   Procedure Laterality Date   ABDOMINAL HYSTERECTOMY     fibroids    Allergies  Allergen Reactions   Hydrochlorothiazide     AKI    Social History   Socioeconomic History   Marital status: Legally Separated    Spouse name: Not on file   Number of children: Not on file   Years of education: Not on file   Highest education level: Not on file  Occupational History   Not on file  Tobacco Use   Smoking status: Never   Smokeless tobacco: Never  Substance and Sexual Activity   Alcohol use: Yes    Alcohol/week: 0.0 standard drinks    Comment: 1-2 per month   Drug use: Not on file   Sexual activity: Not on file  Other Topics Concern   Not on file  Social History Narrative   Separated as of 2 months 11/2018    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    2 kids    Drinks occasionally, never smoker    No guns, wears seat belt    BA degree   Social Determinants of Radio broadcast assistant Strain: Not on file  Food Insecurity: Not on file  Transportation Needs: Not on file  Physical Activity: Not on file  Stress: Not on file  Social Connections: Not on file  Intimate Partner Violence: Not on file  Family History  Problem Relation Age of Onset   Diabetes Father    Stroke Father    Seizures Father    Heart disease Mother    Diabetes Mother    Other Sister        in 70s legionarres    Cancer Maternal Grandmother        pancreatitic    Other Brother        colon resected ?reason   Breast cancer Neg Hx      Review of Systems  Nasal congestion  Constitutional: negative for anorexia, fevers and sweats  Eyes: negative for irritation, redness and visual disturbance  Ears, nose, mouth, throat, and face: negative for earaches, epistaxis, and sore throat  Respiratory: negative for cough, dyspnea on exertion, sputum and wheezing  Cardiovascular: negative for chest pain, dyspnea, lower extremity edema, orthopnea, palpitations and syncope  Gastrointestinal: negative for  abdominal pain, constipation, diarrhea, melena, nausea and vomiting  Genitourinary:negative for dysuria, frequency and hematuria  Hematologic/lymphatic: negative for bleeding, easy bruising and lymphadenopathy  Musculoskeletal:negative for arthralgias, muscle weakness and stiff joints  Neurological: negative for coordination problems, gait problems, headaches and weakness  Endocrine: negative for diabetic symptoms including polydipsia, polyuria and weight loss     Objective:   Physical Exam  Gen. Pleasant, obese, in no distress, normal affect ENT - no pallor,icterus, no post nasal drip, class 2 airway Neck: No JVD, no thyromegaly, no carotid bruits Lungs: no use of accessory muscles, no dullness to percussion, decreased without rales or rhonchi  Cardiovascular: Rhythm regular, heart sounds  normal, no murmurs or gallops, no peripheral edema Abdomen: soft and non-tender, no hepatosplenomegaly, BS normal. Musculoskeletal: No deformities, no cyanosis or clubbing Neuro:  alert, non focal, no tremors       Assessment & Plan:

## 2021-10-03 NOTE — Telephone Encounter (Signed)
Patient informed and verbalized understanding

## 2021-10-09 ENCOUNTER — Encounter: Payer: Self-pay | Admitting: Pulmonary Disease

## 2021-10-09 ENCOUNTER — Encounter: Payer: Self-pay | Admitting: Internal Medicine

## 2021-10-09 NOTE — Telephone Encounter (Signed)
Dr. Vassie Loll please advise on patient mychart message. Thank you     I have attached the results you were looking for from [facility].     After reducing the humidity level from 5 to 4 was all it took to not wake up congested.   I actually am experiencing the energy that I've heard others say they have using the CPAP machine. I am agreeable to continue using the CPAP and NOT schedule another sleep study unless you deem it necessary.   Please advise.   Thank you

## 2021-10-09 NOTE — Telephone Encounter (Signed)
For your information  

## 2021-10-10 NOTE — Telephone Encounter (Signed)
Copy off the printer and sent to scan

## 2021-10-23 DIAGNOSIS — Z724 Inappropriate diet and eating habits: Secondary | ICD-10-CM | POA: Diagnosis not present

## 2021-10-23 DIAGNOSIS — E785 Hyperlipidemia, unspecified: Secondary | ICD-10-CM | POA: Diagnosis not present

## 2021-10-23 DIAGNOSIS — I10 Essential (primary) hypertension: Secondary | ICD-10-CM | POA: Diagnosis not present

## 2021-10-23 DIAGNOSIS — E1165 Type 2 diabetes mellitus with hyperglycemia: Secondary | ICD-10-CM | POA: Diagnosis not present

## 2021-11-07 ENCOUNTER — Telehealth: Payer: Self-pay

## 2021-11-07 ENCOUNTER — Ambulatory Visit
Admission: RE | Admit: 2021-11-07 | Discharge: 2021-11-07 | Disposition: A | Discharge status: Home / Self Care | Payer: BC Managed Care – PPO | Source: Ambulatory Visit | Attending: Internal Medicine | Admitting: Internal Medicine

## 2021-11-07 ENCOUNTER — Other Ambulatory Visit: Payer: Self-pay

## 2021-11-07 DIAGNOSIS — Z780 Asymptomatic menopausal state: Secondary | ICD-10-CM | POA: Diagnosis not present

## 2021-11-07 DIAGNOSIS — Z1231 Encounter for screening mammogram for malignant neoplasm of breast: Secondary | ICD-10-CM | POA: Insufficient documentation

## 2021-11-07 DIAGNOSIS — E2839 Other primary ovarian failure: Secondary | ICD-10-CM | POA: Insufficient documentation

## 2021-11-07 DIAGNOSIS — M85851 Other specified disorders of bone density and structure, right thigh: Secondary | ICD-10-CM | POA: Diagnosis not present

## 2021-11-07 NOTE — Telephone Encounter (Signed)
LMTCB in regards to lab results.  

## 2021-11-08 NOTE — Telephone Encounter (Signed)
Pt returning call for labs 

## 2021-11-20 ENCOUNTER — Encounter: Payer: Self-pay | Admitting: Internal Medicine

## 2021-11-20 NOTE — Telephone Encounter (Signed)
Thressa Sheller, CMA  11/20/2021  2:02 PM EST     Patient has seen results on mychart. Mychart message sent to respond with any further questions.   Adair Laundry, [place]  11/07/2021  3:05 PM EST     LMTCB   Nino Glow McLean-Scocuzza, MD  11/07/2021 10:02 AM EST     Thin bones rec vitamin D3 daily otc 4000-5000 IU and  Calcium 600 mg 1-2 pills daily with food

## 2021-11-21 NOTE — Telephone Encounter (Signed)
Nino Glow McLean-Scocuzza, MD  11/07/2021 10:02 AM EST     Thin bones rec vitamin D3 daily otc 4000-5000 IU and  Calcium 600 mg 1-2 pills daily with food   Please advise

## 2021-12-20 ENCOUNTER — Other Ambulatory Visit: Payer: Self-pay

## 2021-12-20 ENCOUNTER — Ambulatory Visit: Payer: BC Managed Care – PPO | Admitting: Internal Medicine

## 2021-12-20 ENCOUNTER — Encounter: Payer: Self-pay | Admitting: Internal Medicine

## 2021-12-20 VITALS — BP 130/78 | HR 94 | Temp 98.7°F | Ht 65.0 in | Wt 197.0 lb

## 2021-12-20 DIAGNOSIS — E785 Hyperlipidemia, unspecified: Secondary | ICD-10-CM

## 2021-12-20 DIAGNOSIS — R7303 Prediabetes: Secondary | ICD-10-CM | POA: Diagnosis not present

## 2021-12-20 DIAGNOSIS — Z1231 Encounter for screening mammogram for malignant neoplasm of breast: Secondary | ICD-10-CM | POA: Diagnosis not present

## 2021-12-20 DIAGNOSIS — Z23 Encounter for immunization: Secondary | ICD-10-CM

## 2021-12-20 DIAGNOSIS — I10 Essential (primary) hypertension: Secondary | ICD-10-CM

## 2021-12-20 LAB — HEMOGLOBIN A1C: Hgb A1c MFr Bld: 6.4 % (ref 4.6–6.5)

## 2021-12-20 LAB — CBC WITH DIFFERENTIAL/PLATELET
Basophils Absolute: 0.1 10*3/uL (ref 0.0–0.1)
Basophils Relative: 0.7 % (ref 0.0–3.0)
Eosinophils Absolute: 0.1 10*3/uL (ref 0.0–0.7)
Eosinophils Relative: 1.7 % (ref 0.0–5.0)
HCT: 41.6 % (ref 36.0–46.0)
Hemoglobin: 13.6 g/dL (ref 12.0–15.0)
Lymphocytes Relative: 25.1 % (ref 12.0–46.0)
Lymphs Abs: 2 10*3/uL (ref 0.7–4.0)
MCHC: 32.6 g/dL (ref 30.0–36.0)
MCV: 89.8 fl (ref 78.0–100.0)
Monocytes Absolute: 0.5 10*3/uL (ref 0.1–1.0)
Monocytes Relative: 6.8 % (ref 3.0–12.0)
Neutro Abs: 5.2 10*3/uL (ref 1.4–7.7)
Neutrophils Relative %: 65.7 % (ref 43.0–77.0)
Platelets: 264 10*3/uL (ref 150.0–400.0)
RBC: 4.63 Mil/uL (ref 3.87–5.11)
RDW: 13.3 % (ref 11.5–15.5)
WBC: 7.9 10*3/uL (ref 4.0–10.5)

## 2021-12-20 MED ORDER — SHINGRIX 50 MCG/0.5ML IM SUSR: 0.5000 mL | Freq: Once | INTRAMUSCULAR | 0 refills | Status: AC

## 2021-12-20 MED ORDER — AMLODIPINE BESYLATE 5 MG PO TABS: 5.0000 mg | ORAL_TABLET | Freq: Every day | ORAL | 3 refills | Status: DC

## 2021-12-20 NOTE — Progress Notes (Signed)
Chief Complaint  Patient presents with   Follow-up   F/u  1. Htn controlled today in the pm  some sbp elevations 140s-150s  2. Prediabetes/hld will check labs today  3. Shingrix 2nd dose due will get at pharmacy    Review of Systems  Constitutional:  Negative for weight loss.  HENT:  Negative for hearing loss.   Eyes:  Negative for blurred vision.  Respiratory:  Negative for shortness of breath.   Cardiovascular:  Negative for chest pain.  Gastrointestinal:  Negative for abdominal pain and blood in stool.  Genitourinary:  Negative for dysuria.  Musculoskeletal:  Negative for falls and joint pain.  Skin:  Negative for rash.  Neurological:  Negative for headaches.  Psychiatric/Behavioral:  Negative for depression.   Past Medical History:  Diagnosis Date   Arthritis    left shoulder   Cataract    ? which eye mild    COVID-19    11/04/21 sneezing   Diabetes mellitus without complication (HCC)    GERD (gastroesophageal reflux disease)    Hypertension    Migraines    OSA on CPAP    Prediabetes    Sleep apnea    UTI (urinary tract infection)    Past Surgical History:  Procedure Laterality Date   ABDOMINAL HYSTERECTOMY     fibroids   Family History  Problem Relation Age of Onset   Diabetes Father    Stroke Father    Seizures Father    Heart disease Mother    Diabetes Mother    Other Sister        in 51s legionarres    Cancer Maternal Grandmother        pancreatitic    Other Brother        colon resected ?reason   Breast cancer Neg Hx    Social History   Socioeconomic History   Marital status: Legally Separated    Spouse name: Not on file   Number of children: Not on file   Years of education: Not on file   Highest education level: Not on file  Occupational History   Not on file  Tobacco Use   Smoking status: Never   Smokeless tobacco: Never  Substance and Sexual Activity   Alcohol use: Yes    Alcohol/week: 0.0 standard drinks    Comment: 1-2 per  month   Drug use: Not on file   Sexual activity: Not on file  Other Topics Concern   Not on file  Social History Narrative   Separated as of 2 months 11/2018    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   Zoster Vaccine Adjuvanted (SHINGRIX) injection Inject 0.5 mLs into the muscle once for 1 dose.   [DISCONTINUED] amLODipine (NORVASC) 5 MG tablet Take 1 tablet (5 mg total) by mouth at bedtime.   Allergies  Allergen  Reactions   Hydrochlorothiazide     AKI   Recent Results (from the past 2160 hour(s))  Basic metabolic panel     Status: Abnormal   Collection Time: 09/29/21  8:45 AM  Result Value Ref Range   Sodium 139 135 - 145 mEq/L   Potassium 4.0 3.5 - 5.1 mEq/L   Chloride 104 96 - 112 mEq/L   CO2 27 19 - 32 mEq/L   Glucose, Bld 101 (H) 70 - 99 mg/dL   BUN 10 6 - 23 mg/dL   Creatinine, Ser 0.85 0.40 - 1.20 mg/dL   GFR 73.30 >60.00 mL/min    Comment: Calculated using the CKD-EPI Creatinine Equation (2021)   Calcium 9.5 8.4 - 10.5 mg/dL   Objective  Body mass index is 32.78 kg/m. Wt Readings from Last 3 Encounters:  12/20/21 197 lb (89.4 kg)  10/02/21 195 lb 6.4 oz (88.6 kg)  09/15/21 193 lb 3.2 oz (87.6 kg)   Temp Readings from Last 3 Encounters:  12/20/21 98.7 F (37.1 C) (Oral)  10/02/21 98.1 F (36.7 C) (Oral)  09/15/21 98 F (36.7 C) (Oral)   BP Readings from Last 3 Encounters:  12/20/21 130/78  10/02/21 132/70   09/15/21 126/78   Pulse Readings from Last 3 Encounters:  12/20/21 94  10/02/21 92  09/15/21 92    Physical Exam Vitals and nursing note reviewed.  Constitutional:      Appearance: Normal appearance. She is well-developed and well-groomed.  HENT:     Head: Normocephalic and atraumatic.  Eyes:     Conjunctiva/sclera: Conjunctivae normal.     Pupils: Pupils are equal, round, and reactive to light.  Cardiovascular:     Rate and Rhythm: Normal rate and regular rhythm.     Heart sounds: Normal heart sounds. No murmur heard. Pulmonary:     Effort: Pulmonary effort is normal.     Breath sounds: Normal breath sounds.  Abdominal:     General: Abdomen is flat. Bowel sounds are normal.     Tenderness: There is no abdominal tenderness.  Musculoskeletal:        General: No tenderness.  Skin:    General: Skin is warm and dry.  Neurological:     General: No focal deficit present.     Mental Status: She is alert and oriented to person, place, and time. Mental status is at baseline.     Cranial Nerves: Cranial nerves 2-12 are intact.     Motor: Motor function is intact.     Coordination: Coordination is intact.     Gait: Gait is intact.  Psychiatric:        Attention and Perception: Attention and perception normal.        Mood and Affect: Mood and affect normal.        Speech: Speech normal.        Behavior: Behavior normal. Behavior is cooperative.        Thought Content: Thought content normal.        Cognition and Memory: Cognition and memory normal.        Judgment: Judgment normal.    Assessment  Plan  Hypertension, controlled on norvasc 5 mg qd - Plan: Comprehensive metabolic panel, Lipid panel, CBC with Differential/Platelet Monitor BP    Hyperlipidemia, unspecified hyperlipidemia type - Plan: Lipid panel  Prediabetes - Plan: Hemoglobin A1c  Essential hypertension - Plan: amLODipine (NORVASC) 5 MG tablet   HM Declines flu shot  Tdap 10/03/15  1/2 shingrix get 2nd  dose pharm  Had J&J x 1 consider pfizer  Consider prevnar declines Consider hep B,MMR in future     mammo 11/07/21 negative ordered repeat ordered    EGD 08/09/10 mild gastritis  Colonoscopy Dr. Tiffany Kocher 2015 get report 10/05/14 repeat due 10/05/24    S/p hysterectomy no h/o abnl had in 2012   DEXA 11/07/21 osteopenia rec calcium and vitamin      HCV 09/30/17 negative  [facility] seen 2022 right gland tear duct blocked as of 03/15/21 no appt yet  Dentist Dr. Lynelle Smoke  [facility] [place] call back if interested left breast SK removal  rec healthy diet and exercise    Provider: Dr. Olivia Mackie McLean-Scocuzza-[facility]

## 2021-12-20 NOTE — Patient Instructions (Addendum)
Nuna car seat and stroller  Car cover covers from [employer]  Zoster Vaccine, Recombinant injection What is this medication? ZOSTER VACCINE (ZOS ter vak SEEN) is a vaccine used to reduce the risk of getting shingles. This vaccine is not used to treat shingles or nerve pain from shingles. This medicine may be used for other purposes; ask your health care provider or pharmacist if you have questions. COMMON BRAND NAME(S): [facility] What should I tell my care team before I take this medication? They need to know if you have any of these conditions: cancer immune system problems an unusual or allergic reaction to Zoster vaccine, other medications, foods, dyes, or preservatives pregnant or trying to get pregnant breast-feeding How should I use this medication? This vaccine is injected into a muscle. It is given by a health care provider. A copy of Vaccine Information Statements will be given before each vaccination. Be sure to read this information carefully each time. This sheet may change often. Talk to your health care provider about the use of this vaccine in children. This vaccine is not approved for use in children. Overdosage: If you think you have taken too much of this medicine contact a poison control center or emergency room at once. NOTE: This medicine is only for you. Do not share this medicine with others. What if I miss a dose? Keep appointments for follow-up (booster) doses. It is important not to miss your dose. Call your health care provider if you are unable to keep an appointment. What may interact with this medication? medicines that suppress your immune system medicines to treat cancer steroid medicines like prednisone or cortisone This list may not describe all possible interactions. Give your health care provider a list of all the medicines, herbs, non-prescription drugs, or dietary supplements you use. Also tell them if you smoke, drink alcohol, or use illegal drugs.  Some items may interact with your medicine. What should I watch for while using this medication? Visit your health care provider regularly. This vaccine, like all vaccines, may not fully protect everyone. What side effects may I notice from receiving this medication? Side effects that you should report to your doctor or health care professional as soon as possible: allergic reactions (skin rash, itching or hives; swelling of the face, lips, or tongue) trouble breathing Side effects that usually do not require medical attention (report these to your doctor or health care professional if they continue or are bothersome): chills headache fever nausea pain, redness, or irritation at site where injected tiredness vomiting This list may not describe all possible side effects. Call your doctor for medical advice about side effects. You may report side effects to FDA at 1-800-FDA-1088. Where should I keep my medication? This vaccine is only given by a health care provider. It will not be stored at home. NOTE: This sheet is a summary. It may not cover all possible information. If you have questions about this medicine, talk to your doctor, pharmacist, or health care provider.  2022 Elsevier/Gold Standard (2021-07-18 00:00:00)

## 2021-12-21 ENCOUNTER — Encounter: Payer: Self-pay | Admitting: Internal Medicine

## 2021-12-21 LAB — COMPREHENSIVE METABOLIC PANEL
ALT: 16 U/L (ref 0–35)
AST: 17 U/L (ref 0–37)
Albumin: 4.5 g/dL (ref 3.5–5.2)
Alkaline Phosphatase: 77 U/L (ref 39–117)
BUN: 14 mg/dL (ref 6–23)
CO2: 30 mEq/L (ref 19–32)
Calcium: 9.9 mg/dL (ref 8.4–10.5)
Chloride: 105 mEq/L (ref 96–112)
Creatinine, Ser: 0.9 mg/dL (ref 0.40–1.20)
GFR: 68.33 mL/min (ref >60.00)
Glucose, Bld: 108 mg/dL — ABNORMAL HIGH (ref 70–99)
Potassium: 4.2 mEq/L (ref 3.5–5.1)
Sodium: 141 mEq/L (ref 135–145)
Total Bilirubin: 0.5 mg/dL (ref 0.2–1.2)
Total Protein: 7.2 g/dL (ref 6.0–8.3)

## 2021-12-21 LAB — LIPID PANEL
Cholesterol: 195 mg/dL (ref 0–200)
HDL: 57 mg/dL (ref >39.00)
LDL Cholesterol: 115 mg/dL — ABNORMAL HIGH (ref 0–99)
NonHDL: 138.32
Total CHOL/HDL Ratio: 3
Triglycerides: 118 mg/dL (ref 0.0–149.0)
VLDL: 23.6 mg/dL (ref 0.0–40.0)

## 2021-12-21 NOTE — Telephone Encounter (Signed)
For your information  

## 2022-02-12 DIAGNOSIS — I10 Essential (primary) hypertension: Secondary | ICD-10-CM | POA: Diagnosis not present

## 2022-02-12 DIAGNOSIS — E785 Hyperlipidemia, unspecified: Secondary | ICD-10-CM | POA: Diagnosis not present

## 2022-02-12 DIAGNOSIS — E1165 Type 2 diabetes mellitus with hyperglycemia: Secondary | ICD-10-CM | POA: Diagnosis not present

## 2022-02-12 DIAGNOSIS — Z724 Inappropriate diet and eating habits: Secondary | ICD-10-CM | POA: Diagnosis not present

## 2022-03-15 ENCOUNTER — Encounter: Payer: BC Managed Care – PPO | Admitting: Internal Medicine

## 2022-03-20 ENCOUNTER — Ambulatory Visit (INDEPENDENT_AMBULATORY_CARE_PROVIDER_SITE_OTHER): Payer: BC Managed Care – PPO | Admitting: Internal Medicine

## 2022-03-20 ENCOUNTER — Encounter: Payer: Self-pay | Admitting: Internal Medicine

## 2022-03-20 VITALS — BP 118/74 | HR 72 | Temp 97.7°F | Resp 16 | Ht 65.0 in | Wt 196.1 lb

## 2022-03-20 DIAGNOSIS — E559 Vitamin D deficiency, unspecified: Secondary | ICD-10-CM

## 2022-03-20 DIAGNOSIS — E785 Hyperlipidemia, unspecified: Secondary | ICD-10-CM

## 2022-03-20 DIAGNOSIS — R7303 Prediabetes: Secondary | ICD-10-CM | POA: Diagnosis not present

## 2022-03-20 DIAGNOSIS — Z0000 Encounter for general adult medical examination without abnormal findings: Secondary | ICD-10-CM

## 2022-03-20 DIAGNOSIS — Z1389 Encounter for screening for other disorder: Secondary | ICD-10-CM

## 2022-03-20 DIAGNOSIS — Z6832 Body mass index (BMI) 32.0-32.9, adult: Secondary | ICD-10-CM | POA: Insufficient documentation

## 2022-03-20 DIAGNOSIS — I10 Essential (primary) hypertension: Secondary | ICD-10-CM

## 2022-03-20 DIAGNOSIS — Z1329 Encounter for screening for other suspected endocrine disorder: Secondary | ICD-10-CM

## 2022-03-20 HISTORY — DX: Body mass index (BMI) 32.0-32.9, adult: Z68.32

## 2022-03-20 MED ORDER — WEGOVY 0.25 MG/0.5ML ~~LOC~~ SOAJ: 0.2500 mg | SUBCUTANEOUS | 0 refills | Status: DC

## 2022-03-20 MED ORDER — WEGOVY 1.7 MG/0.75ML ~~LOC~~ SOAJ: 1.7000 mg | SUBCUTANEOUS | 0 refills | Status: DC

## 2022-03-20 MED ORDER — WEGOVY 1 MG/0.5ML ~~LOC~~ SOAJ: 1.0000 mg | SUBCUTANEOUS | 0 refills | Status: DC

## 2022-03-20 MED ORDER — WEGOVY 0.5 MG/0.5ML ~~LOC~~ SOAJ: 0.5000 mg | SUBCUTANEOUS | 0 refills | Status: DC

## 2022-03-20 MED ORDER — WEGOVY 2.4 MG/0.75ML ~~LOC~~ SOAJ: 2.4000 mg | SUBCUTANEOUS | 2 refills | Status: DC

## 2022-03-20 NOTE — Progress Notes (Signed)
Chief Complaint  ?Patient presents with  ? Annual Exam  ? ?Annual  ?Htn controlled on norvasc 5 mg qd hld ldl 115  ?Obesity wants wegovy to try disc today ? ? ?Review of Systems  ?Constitutional:  Negative for weight loss.  ?HENT:  Negative for hearing loss.   ?Eyes:  Negative for blurred vision.  ?Respiratory:  Negative for shortness of breath.   ?Cardiovascular:  Negative for chest pain.  ?Gastrointestinal:  Negative for abdominal pain and blood in stool.  ?Genitourinary:  Negative for dysuria.  ?Musculoskeletal:  Negative for falls and joint pain.  ?Skin:  Negative for rash.  ?Neurological:  Negative for headaches.  ?Psychiatric/Behavioral:  Negative for depression.   ?Past Medical History:  ?Diagnosis Date  ? Arthritis   ? left shoulder  ? Cataract   ? ? which eye mild   ? COVID-19   ? 11/04/21 sneezing  ? Diabetes mellitus without complication (HCC)   ? GERD (gastroesophageal reflux disease)   ? Hypertension   ? Migraines   ? OSA on CPAP   ? Prediabetes   ? Sleep apnea   ? UTI (urinary tract infection)   ? ?Past Surgical History:  ?Procedure Laterality Date  ? ABDOMINAL HYSTERECTOMY    ? fibroids  ? ?Family History  ?Problem Relation Age of Onset  ? Diabetes Father   ? Stroke Father   ? Seizures Father   ? Heart disease Mother   ? Diabetes Mother   ? Other Sister   ?     in 60s legionarres   ? Cancer Maternal Grandmother   ?     pancreatitic   ? Other Brother   ?     colon resected ?reason  ? Breast cancer Neg Hx   ? ?Social History  ? ?Socioeconomic History  ? Marital status: Divorced  ?  Spouse name: Not on file  ? Number of children: Not on file  ? Years of education: Not on file  ? Highest education level: Not on file  ?Occupational History  ? Not on file  ?Tobacco Use  ? Smoking status: Never  ? Smokeless tobacco: Never  ?Substance and Sexual Activity  ? Alcohol use: Yes  ?  Alcohol/week: 0.0 standard drinks  ?  Comment: 1-2 per month  ? Drug use: Not on file  ? Sexual activity: Not on file  ?Other Topics  Concern  ? Not on file  ?Social History Narrative  ? Separated as of 2 months 11/2018   ? 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 2 kids daughters  ? -1 daughter lives in [place]   ? Drinks occasionally, never smoker   ? No guns, wears seat belt   ? BA degree  ? ?Social Determinants of Health  ? ?Financial Resource Strain: Not on file  ?Food Insecurity: Not on file  ?Transportation Needs: Not on file  ?Physical Activity: Not on file  ?Stress: Not on file  ?Social Connections: Not on file  ?Intimate Partner Violence: Not on file  ? ?Current Meds  ?Medication Sig  ? amLODipine (NORVASC) 5 MG tablet Take 1 tablet (5 mg total) by mouth at bedtime.  ? Multiple Vitamin (MULTIVITAMIN) capsule Take 1 capsule by mouth daily.  ? Semaglutide-Weight Management (WEGOVY) 0.25 MG/0.5ML SOAJ Inject 0.25 mg into the skin once a week.  ? Semaglutide-Weight Management (WEGOVY) 0.5 MG/0.5ML SOAJ Inject 0.5 mg into the skin once a week.  ? Semaglutide-Weight Management (WEGOVY) 1 MG/0.5ML SOAJ Inject 1  mg into the skin once a week.  ? Semaglutide-Weight Management (WEGOVY) 1.7 MG/0.75ML SOAJ Inject 1.7 mg into the skin once a week.  ? Semaglutide-Weight Management (WEGOVY) 2.4 MG/0.75ML SOAJ Inject 2.4 mg into the skin once a week.  ? ?Allergies  ?Allergen Reactions  ? Hydrochlorothiazide   ?  AKI  ? ?Recent Results (from the past 2160 hour(s))  ?Comprehensive metabolic panel     Status: Abnormal  ? Collection Time: 12/20/21  8:53 AM  ?Result Value Ref Range  ? Sodium 141 135 - 145 mEq/L  ? Potassium 4.2 3.5 - 5.1 mEq/L  ? Chloride 105 96 - 112 mEq/L  ? CO2 30 19 - 32 mEq/L  ? Glucose, Bld 108 (H) 70 - 99 mg/dL  ? BUN 14 6 - 23 mg/dL  ? Creatinine, Ser 0.90 0.40 - 1.20 mg/dL  ? Total Bilirubin 0.5 0.2 - 1.2 mg/dL  ? Alkaline Phosphatase 77 39 - 117 U/L  ? AST 17 0 - 37 U/L  ? ALT 16 0 - 35 U/L  ? Total Protein 7.2 6.0 - 8.3 g/dL  ? Albumin 4.5 3.5 - 5.2 g/dL  ? GFR 68.33 >60.00 mL/min  ?  Comment: Calculated using the CKD-EPI Creatinine  Equation (2021)  ? Calcium 9.9 8.4 - 10.5 mg/dL  ?Lipid panel     Status: Abnormal  ? Collection Time: 12/20/21  8:53 AM  ?Result Value Ref Range  ? Cholesterol 195 0 - 200 mg/dL  ?  Comment: ATP III Classification       Desirable:  < 200 mg/dL               Borderline High:  200 - 239 mg/dL          High:  > = 354 mg/dL  ? Triglycerides 118.0 0.0 - 149.0 mg/dL  ?  Comment: Normal:  <150 mg/dLBorderline High:  150 - 199 mg/dL  ? HDL 57.00 >39.00 mg/dL  ? VLDL 23.6 0.0 - 40.0 mg/dL  ? LDL Cholesterol 115 (H) 0 - 99 mg/dL  ? Total CHOL/HDL Ratio 3   ?  Comment:                Men          Women1/2 Average Risk     3.4          3.3Average Risk          5.0          4.42X Average Risk          9.6          7.13X Average Risk          15.0          11.0                      ? NonHDL 138.32   ?  Comment: NOTE:  Non-HDL goal should be 30 mg/dL higher than patient's LDL goal (i.e. LDL goal of < 70 mg/dL, would have non-HDL goal of < 100 mg/dL)  ?CBC with Differential/Platelet     Status: None  ? Collection Time: 12/20/21  8:53 AM  ?Result Value Ref Range  ? WBC 7.9 4.0 - 10.5 K/uL  ? RBC 4.63 3.87 - 5.11 Mil/uL  ? Hemoglobin 13.6 12.0 - 15.0 g/dL  ? HCT 41.6 36.0 - 46.0 %  ? MCV 89.8 78.0 - 100.0 fl  ? MCHC 32.6 30.0 - 36.0 g/dL  ?  RDW 13.3 11.5 - 15.5 %  ? Platelets 264.0 150.0 - 400.0 K/uL  ? Neutrophils Relative % 65.7 43.0 - 77.0 %  ? Lymphocytes Relative 25.1 12.0 - 46.0 %  ? Monocytes Relative 6.8 3.0 - 12.0 %  ? Eosinophils Relative 1.7 0.0 - 5.0 %  ? Basophils Relative 0.7 0.0 - 3.0 %  ? Neutro Abs 5.2 1.4 - 7.7 K/uL  ? Lymphs Abs 2.0 0.7 - 4.0 K/uL  ? Monocytes Absolute 0.5 0.1 - 1.0 K/uL  ? Eosinophils Absolute 0.1 0.0 - 0.7 K/uL  ? Basophils Absolute 0.1 0.0 - 0.1 K/uL  ?Hemoglobin A1c     Status: None  ? Collection Time: 12/20/21  8:53 AM  ?Result Value Ref Range  ? Hgb A1c MFr Bld 6.4 4.6 - 6.5 %  ?  Comment: Glycemic Control Guidelines for People with Diabetes:Non Diabetic:  <6%Goal of Therapy: <7%Additional  Action Suggested:  >8%   ? ?Objective  ?Body mass index is 32.64 kg/m?. ?Wt Readings from Last 3 Encounters:  ?03/20/22 196 lb 2 oz (89 kg)  ?12/20/21 197 lb (89.4 kg)  ?10/02/21 195 lb 6.4 oz (88.6 kg)  ? ?Temp Readings from Last 3 Encounters:  ?03/20/22 97.7 ?F (36.5 ?C) (Oral)  ?12/20/21 98.7 ?F (37.1 ?C) (Oral)  ?10/02/21 98.1 ?F (36.7 ?C) (Oral)  ? ?BP Readings from Last 3 Encounters:  ?03/20/22 118/74  ?12/20/21 130/78  ?10/02/21 132/70  ? ?Pulse Readings from Last 3 Encounters:  ?03/20/22 72  ?12/20/21 94  ?10/02/21 92  ? ? ?Physical Exam ?Vitals and nursing note reviewed.  ?Constitutional:   ?   Appearance: Normal appearance. She is well-developed and well-groomed.  ?HENT:  ?   Head: Normocephalic and atraumatic.  ?Eyes:  ?   Conjunctiva/sclera: Conjunctivae normal.  ?   Pupils: Pupils are equal, round, and reactive to light.  ?Cardiovascular:  ?   Rate and Rhythm: Normal rate and regular rhythm.  ?   Heart sounds: Normal heart sounds. No murmur heard. ?Pulmonary:  ?   Effort: Pulmonary effort is normal.  ?   Breath sounds: Normal breath sounds.  ?Abdominal:  ?   General: Abdomen is flat. Bowel sounds are normal.  ?   Tenderness: There is no abdominal tenderness.  ?Musculoskeletal:     ?   General: No tenderness.  ?Skin: ?   General: Skin is warm and dry.  ?Neurological:  ?   General: No focal deficit present.  ?   Mental Status: She is alert and oriented to person, place, and time. Mental status is at baseline.  ?   Cranial Nerves: Cranial nerves 2-12 are intact.  ?   Motor: Motor function is intact.  ?   Coordination: Coordination is intact.  ?   Gait: Gait is intact.  ?Psychiatric:     ?   Attention and Perception: Attention and perception normal.     ?   Mood and Affect: Mood and affect normal.     ?   Speech: Speech normal.     ?   Behavior: Behavior normal. Behavior is cooperative.     ?   Thought Content: Thought content normal.     ?   Cognition and Memory: Cognition and memory normal.     ?    Judgment: Judgment normal.  ? ? ?Assessment  ?Plan  ?Annual physical exam ?See below ? ?Hyperlipidemia, unspecified hyperlipidemia type - Plan: CBC w/Diff, Lipid panel, Semaglutide-Weight Management (WEGOVY)

## 2022-03-20 NOTE — Patient Instructions (Signed)
Semaglutide Injection (Weight Management) ?What is this medication? ?SEMAGLUTIDE (SEM a GLOO tide) promotes weight loss. It may also be used to maintain weight loss. It works by decreasing appetite. Changes to diet and exercise are often combined with this medication. ?This medicine may be used for other purposes; ask your health care provider or pharmacist if you have questions. ?COMMON BRAND NAME(S): Wegovy ?What should I tell my care team before I take this medication? ?They need to know if you have any of these conditions: ?Endocrine tumors (MEN 2) or if someone in your family had these tumors ?Eye disease, vision problems ?Gallbladder disease ?History of depression or mental health disease ?History of pancreatitis ?Kidney disease ?Stomach or intestine problems ?Suicidal thoughts, plans, or attempt; a previous suicide attempt by you or a family member ?Thyroid cancer or if someone in your family had thyroid cancer ?An unusual or allergic reaction to semaglutide, other medications, foods, dyes, or preservatives ?Pregnant or trying to get pregnant ?Breast-feeding ?How should I use this medication? ?This medication is injected under the skin. You will be taught how to prepare and give it. Take it as directed on the prescription label. It is given once every week (every 7 days). Keep taking it unless your care team tells you to stop. ?It is important that you put your used needles and pens in a special sharps container. Do not put them in a trash can. If you do not have a sharps container, call your pharmacist or care team to get one. ?A special MedGuide will be given to you by the pharmacist with each prescription and refill. Be sure to read this information carefully each time. ?This medication comes with INSTRUCTIONS FOR USE. Ask your pharmacist for directions on how to use this medication. Read the information carefully. Talk to your pharmacist or care team if you have questions. ?Talk to your care team about  the use of this medication in children. While it may be prescribed for children as young as 12 years for selected conditions, precautions do apply. ?Overdosage: If you think you have taken too much of this medicine contact a poison control center or emergency room at once. ?NOTE: This medicine is only for you. Do not share this medicine with others. ?What if I miss a dose? ?If you miss a dose and the next scheduled dose is more than 2 days away, take the missed dose as soon as possible. If you miss a dose and the next scheduled dose is less than 2 days away, do not take the missed dose. Take the next dose at your regular time. Do not take double or extra doses. If you miss your dose for 2 weeks or more, take the next dose at your regular time or call your care team to talk about how to restart this medication. ?What may interact with this medication? ?Insulin and other medications for diabetes ?This list may not describe all possible interactions. Give your health care provider a list of all the medicines, herbs, non-prescription drugs, or dietary supplements you use. Also tell them if you smoke, drink alcohol, or use illegal drugs. Some items may interact with your medicine. ?What should I watch for while using this medication? ?Visit your care team for regular checks on your progress. It may be some time before you see the benefit from this medication. ?Drink plenty of fluids while taking this medication. Check with your care team if you have severe diarrhea, nausea, and vomiting, or if you sweat a   lot. The loss of too much body fluid may make it dangerous for you to take this medication. ?This medication may affect blood sugar levels. Ask your care team if changes in diet or medications are needed if you have diabetes. ?If you or your family notice any changes in your behavior, such as new or worsening depression, thoughts of harming yourself, anxiety, other unusual or disturbing thoughts, or memory loss, call  your care team right away. ?Women should inform their care team if they wish to become pregnant or think they might be pregnant. Losing weight while pregnant is not advised and may cause harm to the unborn child. Talk to your care team for more information. ?What side effects may I notice from receiving this medication? ?Side effects that you should report to your care team as soon as possible: ?Allergic reactions--skin rash, itching, hives, swelling of the face, lips, tongue, or throat ?Change in vision ?Dehydration--increased thirst, dry mouth, feeling faint or lightheaded, headache, dark yellow or brown urine ?Gallbladder problems--severe stomach pain, nausea, vomiting, fever ?Heart palpitations--rapid, pounding, or irregular heartbeat ?Kidney injury--decrease in the amount of urine, swelling of the ankles, hands, or feet ?Pancreatitis--severe stomach pain that spreads to your back or gets worse after eating or when touched, fever, nausea, vomiting ?Thoughts of suicide or self-harm, worsening mood, feelings of depression ?Thyroid cancer--new mass or lump in the neck, pain or trouble swallowing, trouble breathing, hoarseness ?Side effects that usually do not require medical attention (report to your care team if they continue or are bothersome): ?Diarrhea ?Loss of appetite ?Nausea ?Stomach pain ?Vomiting ?This list may not describe all possible side effects. Call your doctor for medical advice about side effects. You may report side effects to FDA at 1-800-FDA-1088. ?Where should I keep my medication? ?Keep out of the reach of children and pets. ?Refrigeration (preferred): Store in the refrigerator. Do not freeze. Keep this medication in the original container until you are ready to take it. Get rid of any unused medication after the expiration date. ?Room temperature: If needed, prior to cap removal, the pen can be stored at room temperature for up to 28 days. Protect from light. If it is stored at room  temperature, get rid of any unused medication after 28 days or after it expires, whichever is first. ?It is important to get rid of the medication as soon as you no longer need it or it is expired. You can do this in two ways: ?Take the medication to a medication take-back program. Check with your pharmacy or law enforcement to find a location. ?If you cannot return the medication, follow the directions in the MedGuide. ?NOTE: This sheet is a summary. It may not cover all possible information. If you have questions about this medicine, talk to your doctor, pharmacist, or health care provider. ?? 2023 Elsevier/Gold Standard (2021-11-15 00:00:00) ? ?

## 2022-05-01 ENCOUNTER — Encounter: Payer: Self-pay | Admitting: Internal Medicine

## 2022-05-02 ENCOUNTER — Telehealth: Payer: Self-pay | Admitting: *Deleted

## 2022-05-02 ENCOUNTER — Other Ambulatory Visit (INDEPENDENT_AMBULATORY_CARE_PROVIDER_SITE_OTHER): Payer: BC Managed Care – PPO

## 2022-05-02 ENCOUNTER — Other Ambulatory Visit: Payer: Self-pay

## 2022-05-02 DIAGNOSIS — Z1389 Encounter for screening for other disorder: Secondary | ICD-10-CM | POA: Diagnosis not present

## 2022-05-02 DIAGNOSIS — R7303 Prediabetes: Secondary | ICD-10-CM

## 2022-05-02 DIAGNOSIS — I10 Essential (primary) hypertension: Secondary | ICD-10-CM | POA: Diagnosis not present

## 2022-05-02 DIAGNOSIS — E785 Hyperlipidemia, unspecified: Secondary | ICD-10-CM

## 2022-05-02 DIAGNOSIS — Z1329 Encounter for screening for other suspected endocrine disorder: Secondary | ICD-10-CM | POA: Diagnosis not present

## 2022-05-02 DIAGNOSIS — E559 Vitamin D deficiency, unspecified: Secondary | ICD-10-CM

## 2022-05-02 DIAGNOSIS — E673 Hypervitaminosis D: Secondary | ICD-10-CM

## 2022-05-02 LAB — COMPREHENSIVE METABOLIC PANEL
ALT: 14 U/L (ref 0–35)
AST: 17 U/L (ref 0–37)
Albumin: 4.3 g/dL (ref 3.5–5.2)
Alkaline Phosphatase: 82 U/L (ref 39–117)
BUN: 11 mg/dL (ref 6–23)
CO2: 27 mEq/L (ref 19–32)
Calcium: 9.5 mg/dL (ref 8.4–10.5)
Chloride: 104 mEq/L (ref 96–112)
Creatinine, Ser: 0.89 mg/dL (ref 0.40–1.20)
GFR: 69.08 mL/min (ref >60.00)
Glucose, Bld: 113 mg/dL — ABNORMAL HIGH (ref 70–99)
Potassium: 3.9 mEq/L (ref 3.5–5.1)
Sodium: 138 mEq/L (ref 135–145)
Total Bilirubin: 0.4 mg/dL (ref 0.2–1.2)
Total Protein: 7.2 g/dL (ref 6.0–8.3)

## 2022-05-02 LAB — TSH: TSH: 2.37 u[IU]/mL (ref 0.35–5.50)

## 2022-05-02 LAB — CBC WITH DIFFERENTIAL/PLATELET
Basophils Absolute: 0.1 10*3/uL (ref 0.0–0.1)
Basophils Relative: 0.7 % (ref 0.0–3.0)
Eosinophils Absolute: 0.1 10*3/uL (ref 0.0–0.7)
Eosinophils Relative: 2.1 % (ref 0.0–5.0)
HCT: 39.1 % (ref 36.0–46.0)
Hemoglobin: 12.9 g/dL (ref 12.0–15.0)
Lymphocytes Relative: 27 % (ref 12.0–46.0)
Lymphs Abs: 1.9 10*3/uL (ref 0.7–4.0)
MCHC: 33.1 g/dL (ref 30.0–36.0)
MCV: 89.6 fl (ref 78.0–100.0)
Monocytes Absolute: 0.6 10*3/uL (ref 0.1–1.0)
Monocytes Relative: 8.8 % (ref 3.0–12.0)
Neutro Abs: 4.3 10*3/uL (ref 1.4–7.7)
Neutrophils Relative %: 61.4 % (ref 43.0–77.0)
Platelets: 240 10*3/uL (ref 150.0–400.0)
RBC: 4.36 Mil/uL (ref 3.87–5.11)
RDW: 13.3 % (ref 11.5–15.5)
WBC: 6.9 10*3/uL (ref 4.0–10.5)

## 2022-05-02 LAB — LIPID PANEL
Cholesterol: 174 mg/dL (ref 0–200)
HDL: 44.8 mg/dL (ref >39.00)
LDL Cholesterol: 109 mg/dL — ABNORMAL HIGH (ref 0–99)
NonHDL: 129.38
Total CHOL/HDL Ratio: 4
Triglycerides: 100 mg/dL (ref 0.0–149.0)
VLDL: 20 mg/dL (ref 0.0–40.0)

## 2022-05-02 LAB — HEMOGLOBIN A1C: Hgb A1c MFr Bld: 6.4 % (ref 4.6–6.5)

## 2022-05-02 LAB — VITAMIN D 25 HYDROXY (VIT D DEFICIENCY, FRACTURES): VITD: 108.24 ng/mL (ref 30.00–100.00)

## 2022-05-02 NOTE — Progress Notes (Signed)
Vit D has been ordered for recheck in 6 wks due to elevated Vit D.

## 2022-05-02 NOTE — Telephone Encounter (Signed)
CRITICAL VALUE STICKER  CRITICAL VALUE: Vitamin D-108.24 (H)  RECEIVER (on-site recipient of call): Silvestre Moment, CMA/XT  DATE & TIME NOTIFIED: 05/02/22 @11 :18am  MESSENGER (representative from lab): [place] @ [facility]  MD NOTIFIED: Dr. (in absence of Dr. Darrick Huntsman)  TIME OF NOTIFICATION: 11:21am  RESPONSE:  see note

## 2022-05-02 NOTE — Telephone Encounter (Signed)
Please find out if patient is taking any multivitamin with Vitamin D in it,  or any vitamin D supplement.  If she is,  tell her to STOP IT .  HER LEVEL IS TOO HIGH.

## 2022-05-03 LAB — URINALYSIS, ROUTINE W REFLEX MICROSCOPIC
Bacteria, UA: NONE SEEN /HPF
Bilirubin Urine: NEGATIVE
Glucose, UA: NEGATIVE
Hgb urine dipstick: NEGATIVE
Hyaline Cast: NONE SEEN /LPF
Ketones, ur: NEGATIVE
Nitrite: NEGATIVE
Protein, ur: NEGATIVE
RBC / HPF: NONE SEEN /HPF (ref 0–2)
Specific Gravity, Urine: 1.018 (ref 1.001–1.035)
WBC, UA: NONE SEEN /HPF (ref 0–5)
pH: 6.5 (ref 5.0–8.0)

## 2022-05-03 LAB — MICROSCOPIC MESSAGE

## 2022-06-06 ENCOUNTER — Encounter: Payer: Self-pay | Admitting: Internal Medicine

## 2022-06-13 ENCOUNTER — Other Ambulatory Visit: Payer: BC Managed Care – PPO

## 2022-06-21 IMAGING — MG DIGITAL SCREENING BILAT W/ TOMO W/ CAD
8 series · 8 of 24 positions shown · non-contrast
Comparison: Previous exam(s).

CLINICAL DATA: Screening.

EXAM:
DIGITAL SCREENING BILATERAL MAMMOGRAM WITH TOMO AND CAD

[R MLO synth-2D]
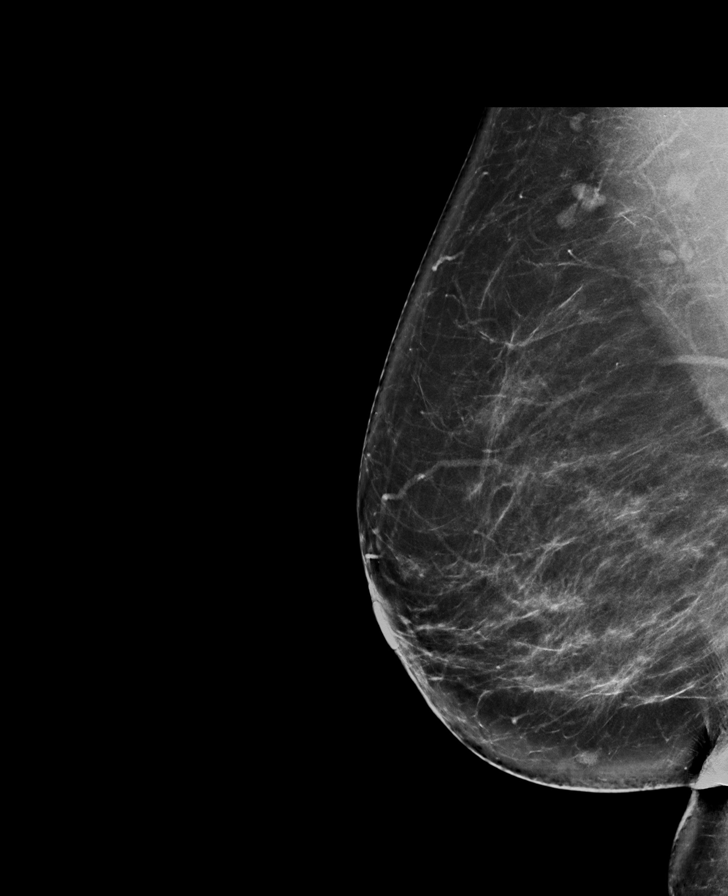

[R CC synth-2D]
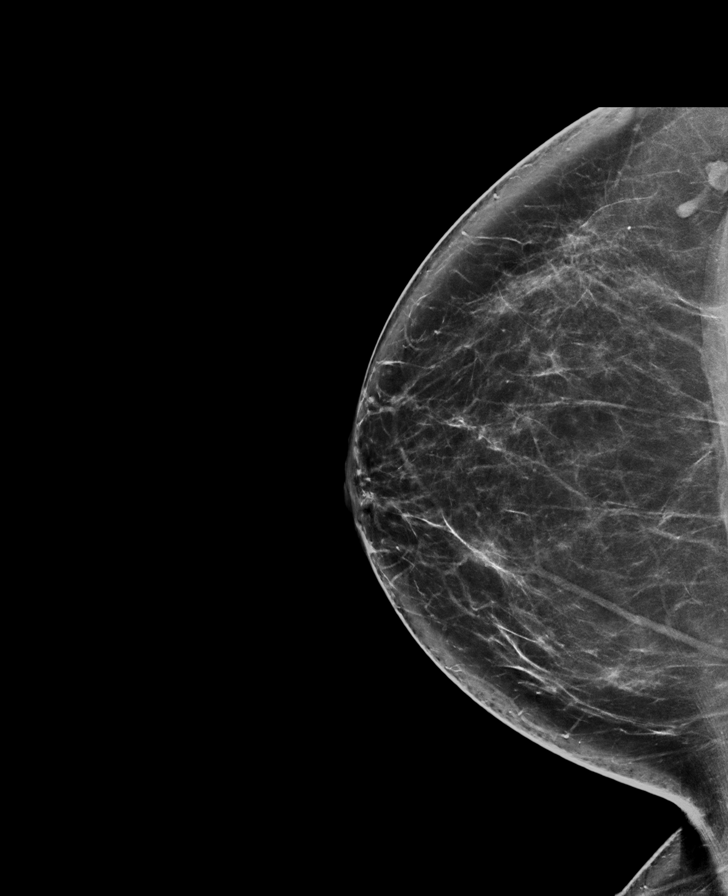

[L CC synth-2D]
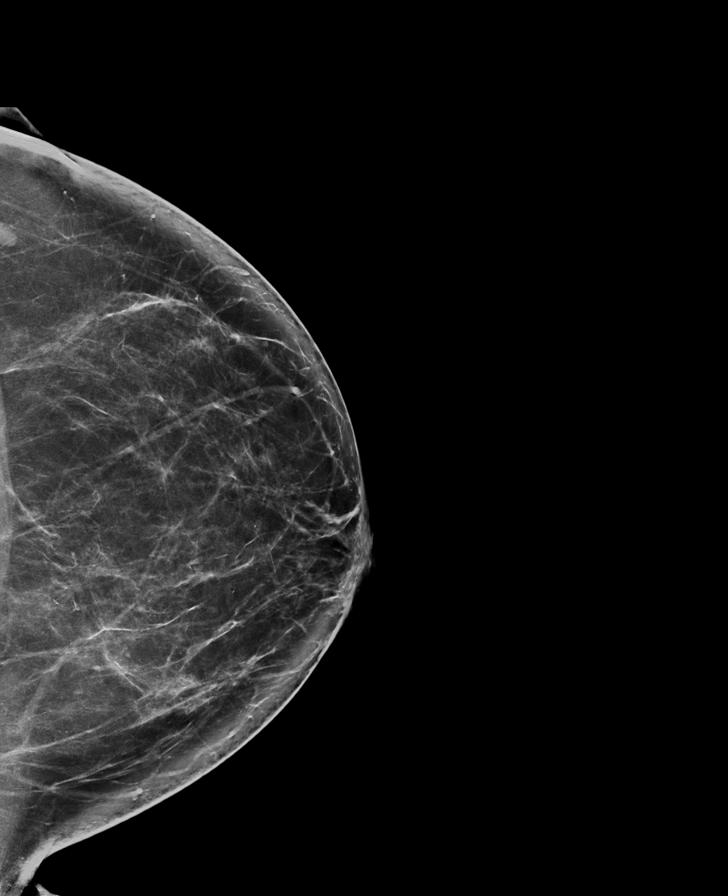

[L MLO synth-2D]
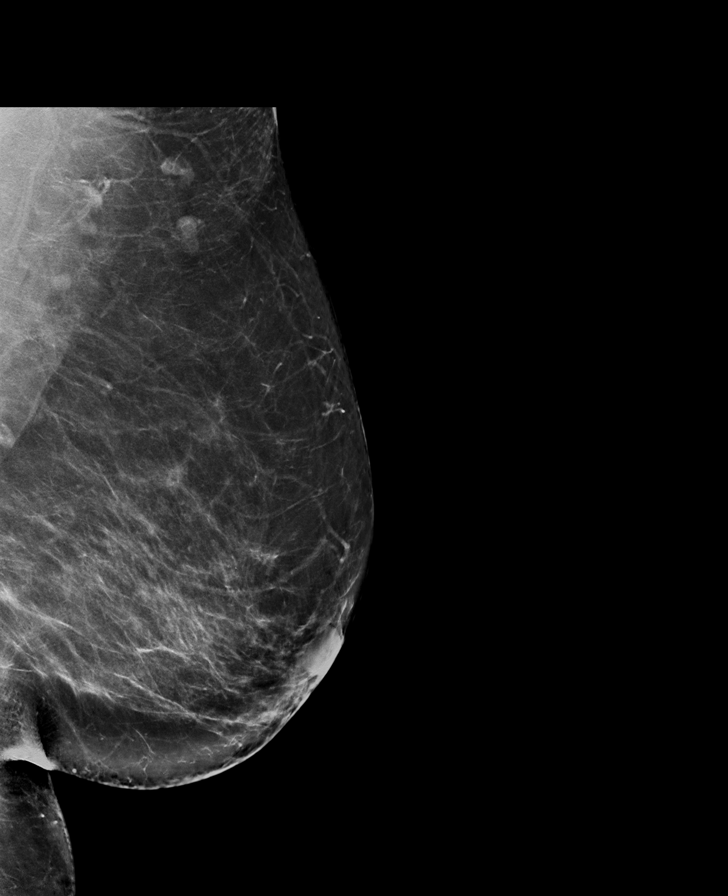

[R CC tomo · tomo slice 45/90.0]
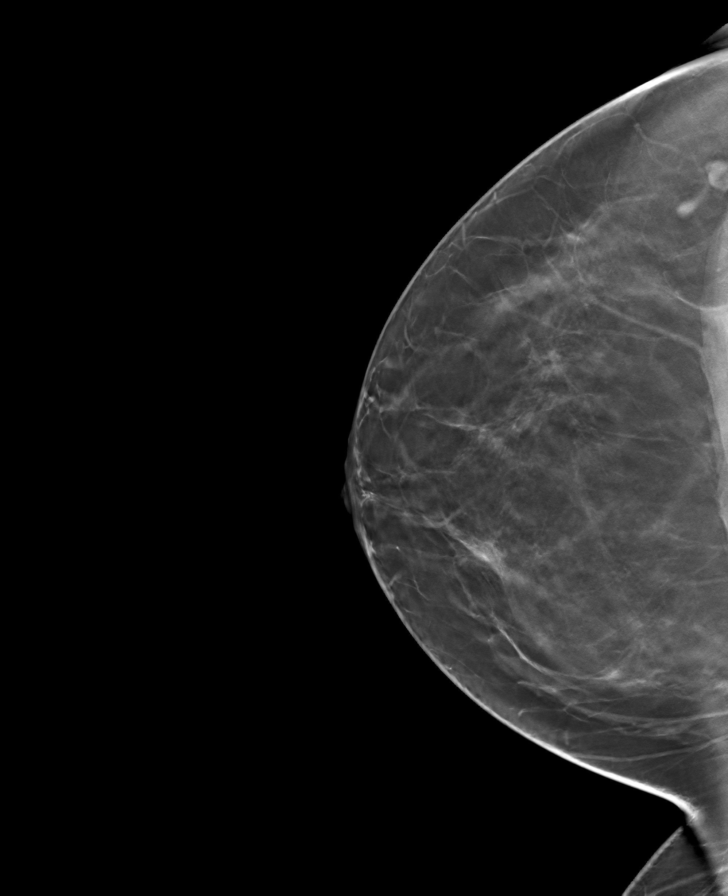

[R MLO tomo · tomo slice 47/92.0]
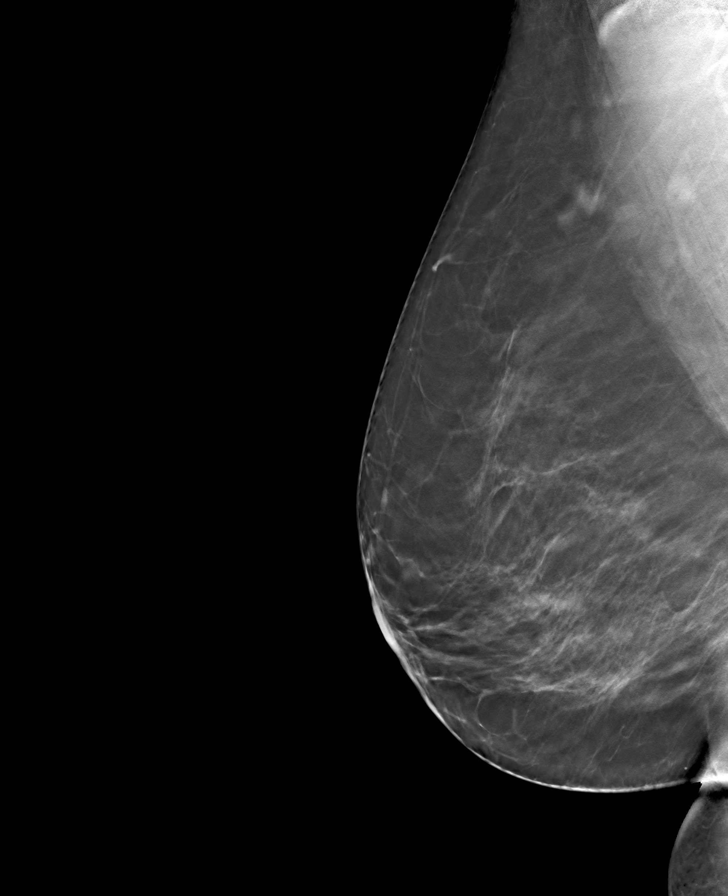

[L CC tomo · tomo slice 45/89.0]
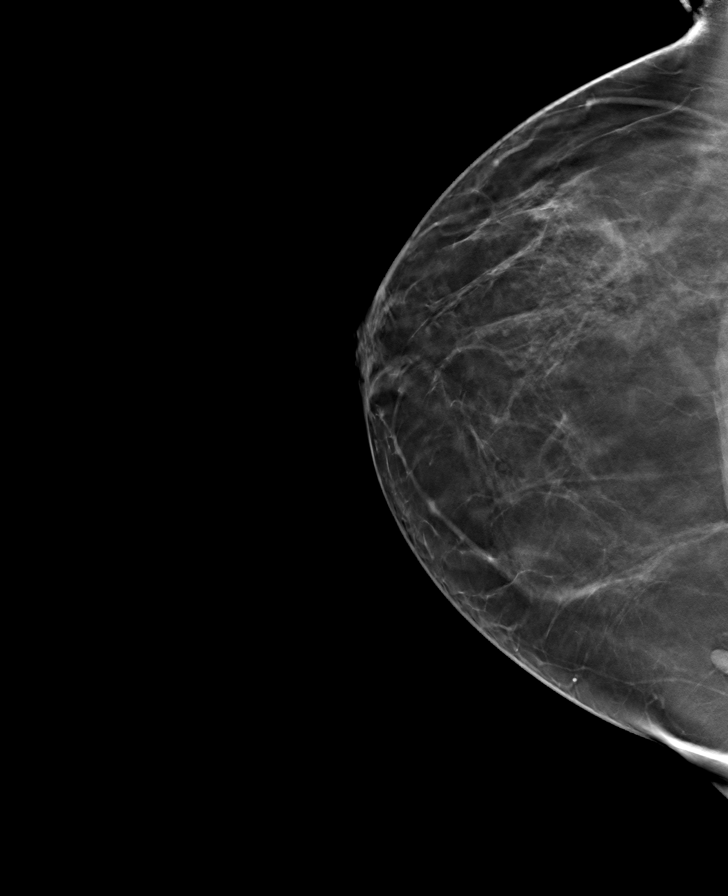

[L MLO tomo · tomo slice 49/98.0]
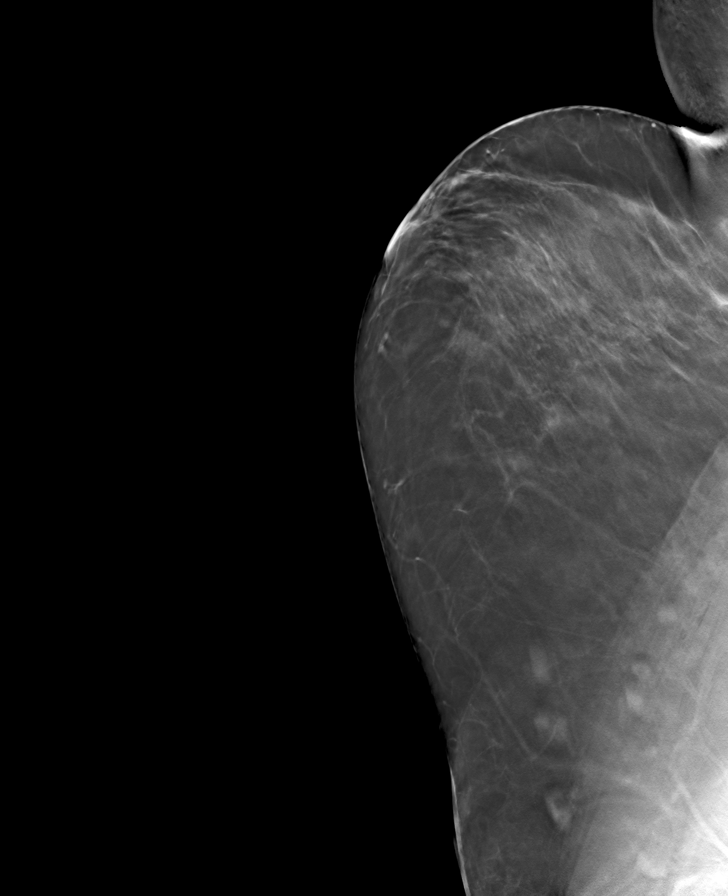

[8 of 24 positions shown; findings below may reference images not displayed]

ACR Breast Density Category b: There are scattered areas of
fibroglandular density.
FINDINGS: There are no findings suspicious for malignancy. Images were
processed with CAD.
IMPRESSION: No mammographic evidence of malignancy. A result letter of this
screening mammogram will be mailed directly to the patient.

RECOMMENDATION:
Screening mammogram in one year. (Code:[id number])

BI-RADS CATEGORY  1: Negative.

## 2022-06-22 ENCOUNTER — Other Ambulatory Visit: Payer: BC Managed Care – PPO

## 2022-06-27 ENCOUNTER — Other Ambulatory Visit (INDEPENDENT_AMBULATORY_CARE_PROVIDER_SITE_OTHER): Payer: BC Managed Care – PPO

## 2022-06-27 DIAGNOSIS — E673 Hypervitaminosis D: Secondary | ICD-10-CM

## 2022-06-27 LAB — VITAMIN D 25 HYDROXY (VIT D DEFICIENCY, FRACTURES): VITD: 44.04 ng/mL (ref 30.00–100.00)

## 2022-07-15 ENCOUNTER — Encounter: Payer: Self-pay | Admitting: Internal Medicine

## 2022-07-18 ENCOUNTER — Encounter: Payer: Self-pay | Admitting: Internal Medicine

## 2022-07-18 ENCOUNTER — Ambulatory Visit: Payer: BC Managed Care – PPO | Admitting: Internal Medicine

## 2022-07-18 VITALS — BP 138/82 | HR 73 | Temp 98.2°F | Ht 65.0 in | Wt 202.2 lb

## 2022-07-18 DIAGNOSIS — R519 Headache, unspecified: Secondary | ICD-10-CM

## 2022-07-18 DIAGNOSIS — H9202 Otalgia, left ear: Secondary | ICD-10-CM

## 2022-07-18 DIAGNOSIS — Z8669 Personal history of other diseases of the nervous system and sense organs: Secondary | ICD-10-CM

## 2022-07-18 DIAGNOSIS — R0789 Other chest pain: Secondary | ICD-10-CM

## 2022-07-18 DIAGNOSIS — I10 Essential (primary) hypertension: Secondary | ICD-10-CM

## 2022-07-18 DIAGNOSIS — R7303 Prediabetes: Secondary | ICD-10-CM

## 2022-07-18 DIAGNOSIS — H6993 Unspecified Eustachian tube disorder, bilateral: Secondary | ICD-10-CM

## 2022-07-18 DIAGNOSIS — E785 Hyperlipidemia, unspecified: Secondary | ICD-10-CM

## 2022-07-18 DIAGNOSIS — Z9989 Dependence on other enabling machines and devices: Secondary | ICD-10-CM

## 2022-07-18 DIAGNOSIS — G4733 Obstructive sleep apnea (adult) (pediatric): Secondary | ICD-10-CM

## 2022-07-18 DIAGNOSIS — H6983 Other specified disorders of Eustachian tube, bilateral: Secondary | ICD-10-CM

## 2022-07-18 DIAGNOSIS — Z6832 Body mass index (BMI) 32.0-32.9, adult: Secondary | ICD-10-CM

## 2022-07-18 HISTORY — DX: Personal history of other diseases of the nervous system and sense organs: Z86.69

## 2022-07-18 MED ORDER — WEGOVY 1.7 MG/0.75ML ~~LOC~~ SOAJ: 1.7000 mg | SUBCUTANEOUS | 0 refills | Status: DC

## 2022-07-18 MED ORDER — WEGOVY 0.25 MG/0.5ML ~~LOC~~ SOAJ: 0.2500 mg | SUBCUTANEOUS | 0 refills | Status: DC

## 2022-07-18 MED ORDER — WEGOVY 0.5 MG/0.5ML ~~LOC~~ SOAJ: 0.5000 mg | SUBCUTANEOUS | 0 refills | Status: DC

## 2022-07-18 MED ORDER — SALINE SPRAY 0.65 % NA SOLN: 1.0000 | NASAL | 11 refills | Status: DC | PRN

## 2022-07-18 MED ORDER — FLUTICASONE PROPIONATE 50 MCG/ACT NA SUSP: 2.0000 | Freq: Every day | NASAL | 11 refills | Status: DC

## 2022-07-18 MED ORDER — WEGOVY 1 MG/0.5ML ~~LOC~~ SOAJ: 1.0000 mg | SUBCUTANEOUS | 0 refills | Status: DC

## 2022-07-18 MED ORDER — AZITHROMYCIN 250 MG PO TABS: ORAL_TABLET | ORAL | 0 refills | Status: AC

## 2022-07-18 MED ORDER — WEGOVY 2.4 MG/0.75ML ~~LOC~~ SOAJ: 2.4000 mg | SUBCUTANEOUS | 2 refills | Status: DC

## 2022-07-18 MED ORDER — NEBIVOLOL HCL 5 MG PO TABS: 5.0000 mg | ORAL_TABLET | Freq: Every day | ORAL | 3 refills | Status: DC

## 2022-07-18 NOTE — Progress Notes (Signed)
Chief Complaint  Patient presents with   Ear Pain   F/u  1. C/w ear infection having left sided sharp pains. She was at [facility] Thursday around college students who were sneezing and she started sneezing with nasal congestion and sore  throat, fatigue and left ear pain and ? Swollen glands, atypical chest pain Friday she was c/w URI/covid and also cpap with humidification w/o heat she ordered new tubing and cleans she thought was cpap machine causing nasal congestion  2. H/a with h/o migraines in the past and saw prior PCP about this but never brain imaging she was having sharp 2-3 sec pain since Thursday worse sat, Sunday, Monday like an ice pick and she thought sensations stopped Tuesday but they returned later Tuesday and she had pressure in area behind left ear  She did not check her Bp   3. Htn on norvasc 5 mg qd when on hctz had decline if GFR now c/o ankle/foot swelling  4. Obesity never started wegovy    Review of Systems  Constitutional:  Negative for weight loss.  HENT:  Negative for hearing loss.   Eyes:  Negative for blurred vision.  Respiratory:  Negative for shortness of breath.   Cardiovascular:  Negative for chest pain.  Gastrointestinal:  Negative for abdominal pain and blood in stool.  Genitourinary:  Negative for dysuria.  Musculoskeletal:  Negative for falls and joint pain.  Skin:  Negative for rash.  Neurological:  Negative for headaches.  Psychiatric/Behavioral:  Negative for depression.    Past Medical History:  Diagnosis Date   Arthritis    left shoulder   Cataract    ? which eye mild    COVID-19    11/04/21 sneezing   Diabetes mellitus without complication (HCC)    GERD (gastroesophageal reflux disease)    Hypertension    Migraines    OSA on CPAP    Prediabetes    Sleep apnea    UTI (urinary tract infection)    Past Surgical History:  Procedure Laterality Date   ABDOMINAL HYSTERECTOMY     fibroids   Family History  Problem Relation Age of Onset    Diabetes Father    Stroke Father    Seizures Father    Heart disease Mother    Diabetes Mother    Other Sister        in 22s legionarres    Cancer Maternal Grandmother        pancreatitic    Other Brother        colon resected ?reason   Breast cancer Neg Hx    Social History   Socioeconomic History   Marital status: Divorced    Spouse name: Not on file   Number of children: Not on file   Years of education: Not on file   Highest education level: Not on file  Occupational History   Not on file  Tobacco Use   Smoking status: Never   Smokeless tobacco: Never  Substance and Sexual Activity   Alcohol use: Yes    Alcohol/week: 0.0 standard drinks of alcohol    Comment: 1-2 per month   Drug use: Not on file   Sexual activity: Not on file  Other Topics Concern   Not on file  Social History Narrative   Separated as of 2 months 11/2018    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    2 kids daughters   -56 daughter lives in [place] with granddaughter born 04/06/22  Drinks occasionally, never smoker    No guns, wears seat belt    BA degree   Social Determinants of Health   Financial Resource Strain: Not on file  Food Insecurity: Not on file  Transportation Needs: Not on file  Physical Activity: Not on file  Stress: Not on file  Social Connections: Not on file  Intimate Partner Violence: Not on file   Current Meds  Medication Sig   azithromycin (ZITHROMAX) 250 MG tablet Take 2 tablets on day 1, then 1 tablet daily on days 2 through 5   fluticasone (FLONASE) 50 MCG/ACT nasal spray Place 2 sprays into both nostrils daily. prn   nebivolol (BYSTOLIC) 5 MG tablet Take 1 tablet (5 mg total) by mouth daily. Dc norvasc 5 mg qd   sodium chloride (OCEAN) 0.65 % SOLN nasal spray Place 1 spray into both nostrils as needed for congestion.   [DISCONTINUED] amLODipine (NORVASC) 5 MG tablet Take 1 tablet (5 mg total) by mouth at bedtime.   Allergies  Allergen Reactions    Hydrochlorothiazide     AKI   Recent Results (from the past 2160 hour(s))  Vitamin D (25 hydroxy)     Status: Abnormal   Collection Time: 05/02/22  7:48 AM  Result Value Ref Range   VITD 108.24 (HH) 30.00 - 100.00 ng/mL  Urinalysis, Routine w reflex microscopic     Status: Abnormal   Collection Time: 05/02/22  7:48 AM  Result Value Ref Range   Color, Urine YELLOW YELLOW   APPearance CLEAR CLEAR   Specific Gravity, Urine 1.018 1.001 - 1.035   pH 6.5 5.0 - 8.0   Glucose, UA NEGATIVE NEGATIVE   Bilirubin Urine NEGATIVE NEGATIVE   Ketones, ur NEGATIVE NEGATIVE   Hgb urine dipstick NEGATIVE NEGATIVE   Protein, ur NEGATIVE NEGATIVE   Nitrite NEGATIVE NEGATIVE   Leukocytes,Ua 1+ (A) NEGATIVE   WBC, UA NONE SEEN 0 - 5 /HPF   RBC / HPF NONE SEEN 0 - 2 /HPF   Squamous Epithelial / LPF 0-5 < OR = 5 /HPF   Bacteria, UA NONE SEEN NONE SEEN /HPF   Hyaline Cast NONE SEEN NONE SEEN /LPF  TSH     Status: None   Collection Time: 05/02/22  7:48 AM  Result Value Ref Range   TSH 2.37 0.35 - 5.50 uIU/mL  Lipid panel     Status: Abnormal   Collection Time: 05/02/22  7:48 AM  Result Value Ref Range   Cholesterol 174 0 - 200 mg/dL    Comment: ATP III Classification       Desirable:  < 200 mg/dL               Borderline High:  200 - 239 mg/dL          High:  > = 240 mg/dL   Triglycerides 100.0 0.0 - 149.0 mg/dL    Comment: Normal:  <150 mg/dLBorderline High:  150 - 199 mg/dL   HDL 44.80 >39.00 mg/dL   VLDL 20.0 0.0 - 40.0 mg/dL   LDL Cholesterol 109 (H) 0 - 99 mg/dL   Total CHOL/HDL Ratio 4     Comment:                Men          Women1/2 Average Risk     3.4          3.3Average Risk          5.0  4.42X Average Risk          9.6          7.13X Average Risk          15.0          11.0                       NonHDL 129.38     Comment: NOTE:  Non-HDL goal should be 30 mg/dL higher than patient's LDL goal (i.e. LDL goal of < 70 mg/dL, would have non-HDL goal of < 100 mg/dL)  Comp Met (CMET)      Status: Abnormal   Collection Time: 05/02/22  7:48 AM  Result Value Ref Range   Sodium 138 135 - 145 mEq/L   Potassium 3.9 3.5 - 5.1 mEq/L   Chloride 104 96 - 112 mEq/L   CO2 27 19 - 32 mEq/L   Glucose, Bld 113 (H) 70 - 99 mg/dL   BUN 11 6 - 23 mg/dL   Creatinine, Ser 0.89 0.40 - 1.20 mg/dL   Total Bilirubin 0.4 0.2 - 1.2 mg/dL   Alkaline Phosphatase 82 39 - 117 U/L   AST 17 0 - 37 U/L   ALT 14 0 - 35 U/L   Total Protein 7.2 6.0 - 8.3 g/dL   Albumin 4.3 3.5 - 5.2 g/dL   GFR 69.08 >60.00 mL/min    Comment: Calculated using the CKD-EPI Creatinine Equation (2021)   Calcium 9.5 8.4 - 10.5 mg/dL  CBC w/Diff     Status: None   Collection Time: 05/02/22  7:48 AM  Result Value Ref Range   WBC 6.9 4.0 - 10.5 K/uL   RBC 4.36 3.87 - 5.11 Mil/uL   Hemoglobin 12.9 12.0 - 15.0 g/dL   HCT 39.1 36.0 - 46.0 %   MCV 89.6 78.0 - 100.0 fl   MCHC 33.1 30.0 - 36.0 g/dL   RDW 13.3 11.5 - 15.5 %   Platelets 240.0 150.0 - 400.0 K/uL   Neutrophils Relative % 61.4 43.0 - 77.0 %   Lymphocytes Relative 27.0 12.0 - 46.0 %   Monocytes Relative 8.8 3.0 - 12.0 %   Eosinophils Relative 2.1 0.0 - 5.0 %   Basophils Relative 0.7 0.0 - 3.0 %   Neutro Abs 4.3 1.4 - 7.7 K/uL   Lymphs Abs 1.9 0.7 - 4.0 K/uL   Monocytes Absolute 0.6 0.1 - 1.0 K/uL   Eosinophils Absolute 0.1 0.0 - 0.7 K/uL   Basophils Absolute 0.1 0.0 - 0.1 K/uL  Hemoglobin A1c     Status: None   Collection Time: 05/02/22  7:48 AM  Result Value Ref Range   Hgb A1c MFr Bld 6.4 4.6 - 6.5 %    Comment: Glycemic Control Guidelines for People with Diabetes:Non Diabetic:  <6%Goal of Therapy: <7%Additional Action Suggested:  >8%   MICROSCOPIC MESSAGE     Status: None   Collection Time: 05/02/22  7:48 AM  Result Value Ref Range   Note      Comment: This urine was analyzed for the presence of WBC,  RBC, bacteria, casts, and other formed elements.  Only those elements seen were reported. . .   VITAMIN D 25 Hydroxy (Vit-D Deficiency, Fractures)      Status: None   Collection Time: 06/27/22  7:35 AM  Result Value Ref Range   VITD 44.04 30.00 - 100.00 ng/mL   Objective  Body mass index is 33.65 kg/m. Wt Readings from  Last 3 Encounters:  07/18/22 202 lb 3.2 oz (91.7 kg)  03/20/22 196 lb 2 oz (89 kg)  12/20/21 197 lb (89.4 kg)   Temp Readings from Last 3 Encounters:  07/18/22 98.2 F (36.8 C) (Oral)  03/20/22 97.7 F (36.5 C) (Oral)  12/20/21 98.7 F (37.1 C) (Oral)   BP Readings from Last 3 Encounters:  07/18/22 138/82  03/20/22 118/74  12/20/21 130/78   Pulse Readings from Last 3 Encounters:  07/18/22 73  03/20/22 72  12/20/21 94    Physical Exam Vitals and nursing note reviewed.  Constitutional:      Appearance: Normal appearance. She is well-developed and well-groomed.  HENT:     Head: Normocephalic and atraumatic.  Eyes:     Conjunctiva/sclera: Conjunctivae normal.     Pupils: Pupils are equal, round, and reactive to light.  Cardiovascular:     Rate and Rhythm: Normal rate and regular rhythm.     Heart sounds: Normal heart sounds. No murmur heard. Pulmonary:     Effort: Pulmonary effort is normal.     Breath sounds: Normal breath sounds.  Abdominal:     General: Abdomen is flat. Bowel sounds are normal.     Tenderness: There is no abdominal tenderness.  Musculoskeletal:        General: No tenderness.  Skin:    General: Skin is warm and dry.  Neurological:     General: No focal deficit present.     Mental Status: She is alert and oriented to person, place, and time. Mental status is at baseline.     Cranial Nerves: Cranial nerves 2-12 are intact.     Motor: Motor function is intact.     Coordination: Coordination is intact.     Gait: Gait is intact.  Psychiatric:        Attention and Perception: Attention and perception normal.        Mood and Affect: Mood and affect normal.        Speech: Speech normal.        Behavior: Behavior normal. Behavior is cooperative.        Thought Content: Thought  content normal.        Cognition and Memory: Cognition and memory normal.        Judgment: Judgment normal.     Assessment  Plan  Left ear pain - Plan: azithromycin (ZITHROMAX) 250 MG tablet only if returns  Hypertension,sl elevated with atypical chest pain - Plan: ECHOCARDIOGRAM COMPLETE, nebivolol (BYSTOLIC) 5 MG tablet stop norvasc 5 mg qd due to ankle/foot edema  Semaglutide-Weight Management (WEGOVY) 0.25 MG/0.5ML SOAJ, Semaglutide-Weight Management (WEGOVY) 0.5 MG/0.5ML SOAJ, Semaglutide-Weight Management (WEGOVY) 1 MG/0.5ML SOAJ, Semaglutide-Weight Management (WEGOVY) 1.7 MG/0.75ML SOAJ, Semaglutide-Weight Management (WEGOVY) 2.4 MG/0.75ML SOAJ  Hyperlipidemia, unspecified hyperlipidemia type - Plan: Semaglutide-Weight Management (WEGOVY) 0.25 MG/0.5ML SOAJ, Semaglutide-Weight Management (WEGOVY) 0.5 MG/0.5ML SOAJ, Semaglutide-Weight Management (WEGOVY) 1 MG/0.5ML SOAJ, Semaglutide-Weight Management (WEGOVY) 1.7 MG/0.75ML SOAJ, Semaglutide-Weight Management (WEGOVY) 2.4 MG/0.75ML SOAJ  Prediabetes - Plan: Semaglutide-Weight Management (WEGOVY) 0.25 MG/0.5ML SOAJ, Semaglutide-Weight Management (WEGOVY) 0.5 MG/0.5ML SOAJ, Semaglutide-Weight Management (WEGOVY) 1 MG/0.5ML SOAJ, Semaglutide-Weight Management (WEGOVY) 1.7 MG/0.75ML SOAJ, Semaglutide-Weight Management (WEGOVY) 2.4 MG/0.75ML SOAJ  BMI 32.0-32.9,adult - Plan: Semaglutide-Weight Management (WEGOVY) 0.25 MG/0.5ML SOAJ, Semaglutide-Weight Management (WEGOVY) 0.5 MG/0.5ML SOAJ, Semaglutide-Weight Management (WEGOVY) 1 MG/0.5ML SOAJ, Semaglutide-Weight Management (WEGOVY) 1.7 MG/0.75ML SOAJ, Semaglutide-Weight Management (WEGOVY) 2.4 MG/0.75ML SOAJ  Atypical chest pain h/o HTN Could be due to stressors with work  Echo   Dysfunction of both eustachian tubes -  Plan: sodium chloride (OCEAN) 0.65 % SOLN nasal spray, fluticasone (FLONASE) 50 MCG/ACT nasal spray Consider prn otc AH  Intractable headache, unspecified chronicity pattern,  unspecified headache type History of migraine  If left sided sharp pains return consider MRI brain pt to call back  She is working on getting new tubing for cpap   HM-repeat fasting labs with new PCP Dr. Volanda Napoleon cmet, cbc, a1c, lipid Declines flu shot  Tdap 10/03/15  2/2 shingrix Had J&J x 1 consider pfizer  Consider prevnar declines Consider hep B,MMR in future     mammo 11/07/21 negative ordered repeat ordered     EGD 08/09/10 mild gastritis  Colonoscopy Dr. Tiffany Kocher 2015 get report 10/05/14 repeat due 10/05/24    S/p hysterectomy no h/o abnl had in 2012   DEXA 11/07/21 osteopenia rec calcium and vitamin      HCV 09/30/17 negative  [facility] 2022 right gland tear duct blocked as of 03/15/21 no appt yet  Dentist Dr. Lynelle Smoke  [facility] [address] call back if interested left breast SK removal  rec healthy diet and exercise   Provider: Dr. Olivia Mackie [facility]

## 2022-07-18 NOTE — Patient Instructions (Addendum)
D3 1000 to 2000 IU daily  Nasal +flonase  Claritin, allegra, zyrtec or xyzal   We will consider MRI brain if sharp pains in head come back   General Headache Without Cause A headache is pain or discomfort felt around the head or neck area. There are many causes and types of headaches. A few common types include: Tension headaches. Migraine headaches. Cluster headaches. Chronic daily headaches. Sometimes, the specific cause of a headache may not be found. Follow these instructions at home: Watch your condition for any changes. Let your health care provider know about them. Take these steps to help with your condition: Managing pain     Take over-the-counter and prescription medicines only as told by your health care provider. Treatment may include medicines for pain that are taken by mouth or applied to the skin. Lie down in a dark, quiet room when you have a headache. Keep lights dim if bright lights bother you or make your headaches worse. If directed, put ice on your head and neck area: Put ice in a plastic bag. Place a towel between your skin and the bag. Leave the ice on for 20 minutes, 2-3 times per day. Remove the ice if your skin turns bright red. This is very important. If you cannot feel pain, heat, or cold, you have a greater risk of damage to the area. If directed, apply heat to the affected area. Use the heat source that your health care provider recommends, such as a moist heat pack or a heating pad. Place a towel between your skin and the heat source. Leave the heat on for 20-30 minutes. Remove the heat if your skin turns bright red. This is especially important if you are unable to feel pain, heat, or cold. You have a greater risk of getting burned. Eating and drinking Eat meals on a regular schedule. If you drink alcohol: Limit how much you have to: 0-1 drink a day for women who are not pregnant. 0-2 drinks a day for men. Know how much alcohol is in a drink. In  the [place] one drink equals one 12 oz bottle of beer (355 mL), one 5 oz glass of wine (148 mL), or one 1 oz glass of hard liquor (44 mL). Stop drinking caffeine, or decrease the amount of caffeine you drink. Drink enough fluid to keep your urine pale yellow. General instructions  Keep a headache journal to help find out what may trigger your headaches. For example, write down: What you eat and drink. How much sleep you get. Any change to your diet or medicines. Try massage or other relaxation techniques. Limit stress. Sit up straight, and do not tense your muscles. Do not use any products that contain nicotine or tobacco. These products include cigarettes, chewing tobacco, and vaping devices, such as e-cigarettes. If you need help quitting, ask your health care provider. Exercise regularly as told by your health care provider. Sleep on a regular schedule. Get 7-9 hours of sleep each night, or the amount recommended by your health care provider. Keep all follow-up visits. This is important. Contact a health care provider if: Medicine does not help your symptoms. You have a headache that is different from your usual headache. You have nausea or you vomit. You have a fever. Get help right away if: Your headache: Becomes severe quickly. Gets worse after moderate to intense physical activity. You have any of these symptoms: Repeated vomiting. Pain or stiffness in your neck. Changes to your vision. Pain  in an eye or ear. Problems with speech. Muscular weakness or loss of muscle control. Loss of balance or coordination. You feel faint or pass out. You have confusion. You have a seizure. These symptoms may represent a serious problem that is an emergency. Do not wait to see if the symptoms will go away. Get medical help right away. Call your local emergency services (911 in the [place] Do not drive yourself to the hospital. Summary A headache is pain or discomfort felt around the head or  neck area. There are many causes and types of headaches. In some cases, the cause may not be found. Keep a headache journal to help find out what may trigger your headaches. Watch your condition for any changes. Let your health care provider know about them. Contact a health care provider if you have a headache that is different from the usual headache, or if your symptoms are not helped by medicine. Get help right away if your headache becomes severe, you vomit, you have a loss of vision, you lose your balance, or you have a seizure. This information is not intended to replace advice given to you by your health care provider. Make sure you discuss any questions you have with your health care provider. Document Revised: 03/29/2021 Document Reviewed: 03/29/2021 Elsevier Patient Education  2023 [employer].

## 2022-07-19 ENCOUNTER — Telehealth: Payer: Self-pay | Admitting: Internal Medicine

## 2022-07-19 NOTE — Telephone Encounter (Signed)
Pt states the sharp pain in left side and back of head. Pt states she was told to call back if it comes back. Please advise and Thank you!  Call pt @ [phone].

## 2022-07-22 ENCOUNTER — Encounter: Payer: Self-pay | Admitting: Internal Medicine

## 2022-07-22 NOTE — Telephone Encounter (Signed)
Ive ordered MRI brain stat If negative consider neurology referral in the future with h/o migraines

## 2022-07-22 NOTE — Addendum Note (Signed)
Addended by: Quentin Ore on: 07/22/2022 10:58 PM   Modules accepted: Orders

## 2022-07-23 NOTE — Telephone Encounter (Signed)
Rec MRI pt declined for now and will call back  Rec neurology pt h/a left sided better for now with h/o of migraines will call back  Dr. French Ana McLean-Scocuzza

## 2022-07-23 NOTE — Telephone Encounter (Signed)
If she doesn't want MRI rec urgent neurology referral is she agreeable to this?

## 2022-08-03 ENCOUNTER — Ambulatory Visit
Admission: RE | Admit: 2022-08-03 | Discharge: 2022-08-03 | Disposition: A | Discharge status: Home / Self Care | Payer: BC Managed Care – PPO | Source: Ambulatory Visit | Attending: Internal Medicine | Admitting: Internal Medicine

## 2022-08-03 DIAGNOSIS — R0789 Other chest pain: Secondary | ICD-10-CM | POA: Diagnosis not present

## 2022-08-03 DIAGNOSIS — I10 Essential (primary) hypertension: Secondary | ICD-10-CM | POA: Insufficient documentation

## 2022-08-03 DIAGNOSIS — R600 Localized edema: Secondary | ICD-10-CM | POA: Insufficient documentation

## 2022-08-03 DIAGNOSIS — R0600 Dyspnea, unspecified: Secondary | ICD-10-CM | POA: Insufficient documentation

## 2022-08-03 DIAGNOSIS — I340 Nonrheumatic mitral (valve) insufficiency: Secondary | ICD-10-CM | POA: Insufficient documentation

## 2022-08-03 DIAGNOSIS — R0609 Other forms of dyspnea: Secondary | ICD-10-CM

## 2022-08-03 DIAGNOSIS — G4730 Sleep apnea, unspecified: Secondary | ICD-10-CM | POA: Diagnosis not present

## 2022-08-03 LAB — ECHOCARDIOGRAM COMPLETE
AR max vel: 2.47 cm2
AV Area VTI: 2.41 cm2
AV Area mean vel: 2.43 cm2
AV Mean grad: 4 mmHg
AV Peak grad: 8.2 mmHg
Ao pk vel: 1.43 m/s
Area-P 1/2: 3.6 cm2
S' Lateral: 3.1 cm

## 2022-08-03 NOTE — Progress Notes (Signed)
*  PRELIMINARY RESULTS* Echocardiogram 2D Echocardiogram has been performed.  Nina Bryant 08/03/2022, 10:43 AM

## 2022-08-09 ENCOUNTER — Encounter: Payer: Self-pay | Admitting: Internal Medicine

## 2022-08-10 ENCOUNTER — Encounter: Payer: Self-pay | Admitting: Pulmonary Disease

## 2022-08-16 ENCOUNTER — Encounter: Payer: Self-pay | Admitting: Internal Medicine

## 2022-08-20 ENCOUNTER — Ambulatory Visit: Payer: BC Managed Care – PPO | Admitting: Nurse Practitioner

## 2022-08-20 ENCOUNTER — Encounter: Payer: Self-pay | Admitting: Nurse Practitioner

## 2022-08-20 DIAGNOSIS — G4733 Obstructive sleep apnea (adult) (pediatric): Secondary | ICD-10-CM

## 2022-08-20 DIAGNOSIS — E669 Obesity, unspecified: Secondary | ICD-10-CM

## 2022-08-20 NOTE — Assessment & Plan Note (Addendum)
Severe OSA with RDI 50/hr in 2017. She has been on CPAP since. Her machine broke around 2 months ago after she tried adding a heated hose to it. She has been unable to use it since. She was told by her medical supply company that it would not be worth fixing, due to age of the machine, and she should request a new one. She was compliant with use otherwise and received benefit from use. Orders placed today for new PAP machine auto 5-15, mask of choice and heated humidification. Encouraged her to try saline nasal gel and/or flonase for nasal congestion from use.   Patient Instructions  Continue to use CPAP every night, minimum of 4-6 hours a night.  Change equipment every 30 days or as directed by DME. Wash your tubing with warm soap and water daily, hang to dry. Wash humidifier portion weekly.  Be aware of reduced alertness and do not drive or operate heavy machinery if experiencing this or drowsiness.  Exercise encouraged, as tolerated. Avoid or decrease alcohol consumption and medications that make you more sleepy, if possible. Notify if persistent daytime sleepiness occurs even with consistent use of CPAP.  Follow up in 10-12 weeks to see how your new machine is working with Dr. Elsworth Soho or Alanson Aly or sooner if needed

## 2022-08-20 NOTE — Patient Instructions (Signed)
Continue to use CPAP every night, minimum of 4-6 hours a night.  Change equipment every 30 days or as directed by DME. Wash your tubing with warm soap and water daily, hang to dry. Wash humidifier portion weekly.  Be aware of reduced alertness and do not drive or operate heavy machinery if experiencing this or drowsiness.  Exercise encouraged, as tolerated. Avoid or decrease alcohol consumption and medications that make you more sleepy, if possible. Notify if persistent daytime sleepiness occurs even with consistent use of CPAP.  Follow up in 10-12 weeks to see how your new machine is working with Dr. Elsworth Soho or Alanson Aly or sooner if needed

## 2022-08-20 NOTE — Assessment & Plan Note (Signed)
Healthy weight loss encouraged 

## 2022-08-20 NOTE — Progress Notes (Signed)
@Patient  ID: Nina Bryant, female    DOB: 1961/06/28, 61 y.o.   MRN: [id number]  Chief Complaint  Patient presents with   Consult    Cpap compliance    Referring provider: McLean-Scocuzza, Olivia Mackie *  HPI: 61 year old female, never smoker followed for severe OSA on CPAP. She is a patient of Dr. Bari Mantis and last seen in office 10/02/2021.  Past medical history significant for HTN, vitamin d deficiency, HLD.  TEST/EVENTS:  03/2016 sleep study: severe OSA with RDI 50/h.   08/20/2022: Today - follow up Patient presents today for overdue follow-up.  She has been on CPAP for around 6 years now.  Her original sleep study was in 2017.  Her machine recently stopped working.  It is giving her an error code.  This happened after she tried to add a heated humidification tube to it.  She has been off of it for about 2 months now because of this.  She was told by [facility] that it was not worth fixing and she likely just needed a new machine since its been > 5 years.  Prior to this, she was wearing it most nights.  She did struggle with it making her feel like she was congested.  She never tried any nasal sprays for this.  She did try cleaning it different ways which did not seem to make much of a difference.  Feels like she never really noticed a huge difference sleeping on it but did not snore from what she was told.  She denies any morning headaches, drowsy driving, sleep parasomnia/paralysis, symptoms of narcolepsy or cataplexy. No significant daytime fatigue symptoms.   Allergies  Allergen Reactions   Hydrochlorothiazide     AKI    Immunization History  Administered Date(s) Administered   Janssen (J&J) SARS-COV-2 Vaccination 02/16/2020   Tdap 10/03/2015   Zoster Recombinat (Shingrix) 09/29/2021, 01/05/2022    Past Medical History:  Diagnosis Date   Arthritis    left shoulder   Cataract    ? which eye mild    COVID-19    11/04/21 sneezing   Diabetes mellitus without complication (HCC)     GERD (gastroesophageal reflux disease)    Hypertension    Migraines    OSA on CPAP    Prediabetes    Sleep apnea    UTI (urinary tract infection)     Tobacco History: Social History   Tobacco Use  Smoking Status Never  Smokeless Tobacco Never   Counseling given: Not Answered   Outpatient Medications Prior to Visit  Medication Sig Dispense Refill   fluticasone (FLONASE) 50 MCG/ACT nasal spray Place 2 sprays into both nostrils daily. prn 16 g 11   sodium chloride (OCEAN) 0.65 % SOLN nasal spray Place 1 spray into both nostrils as needed for congestion. 30 mL 11   Multiple Vitamin (MULTIVITAMIN) capsule Take 1 capsule by mouth daily. (Patient not taking: Reported on 07/18/2022)     nebivolol (BYSTOLIC) 5 MG tablet Take 1 tablet (5 mg total) by mouth daily. Dc norvasc 5 mg qd 90 tablet 3   Semaglutide-Weight Management (WEGOVY) 0.25 MG/0.5ML SOAJ Inject 0.25 mg into the skin once a week. (Patient not taking: Reported on 08/20/2022) 2 mL 0   Semaglutide-Weight Management (WEGOVY) 0.5 MG/0.5ML SOAJ Inject 0.5 mg into the skin once a week. (Patient not taking: Reported on 08/20/2022) 2 mL 0   Semaglutide-Weight Management (WEGOVY) 1 MG/0.5ML SOAJ Inject 1 mg into the skin once a week. (Patient not taking: Reported  on 08/20/2022) 2 mL 0   Semaglutide-Weight Management (WEGOVY) 1.7 MG/0.75ML SOAJ Inject 1.7 mg into the skin once a week. (Patient not taking: Reported on 08/20/2022) 3 mL 0   Semaglutide-Weight Management (WEGOVY) 2.4 MG/0.75ML SOAJ Inject 2.4 mg into the skin once a week. (Patient not taking: Reported on 08/20/2022) 3 mL 2   No facility-administered medications prior to visit.     Review of Systems:   Constitutional: No weight loss or gain, night sweats, fevers, chills, fatigue, or lassitude. HEENT: No headaches, difficulty swallowing, tooth/dental problems, or sore throat. No sneezing, itching, ear ache. +nasal congestion (with CPAP) CV:  No chest pain, orthopnea, PND,  swelling in lower extremities, anasarca, dizziness, palpitations, syncope Resp: +snoring. No shortness of breath with exertion or at rest. No excess mucus or change in color of mucus. No productive or non-productive. No hemoptysis. No wheezing.  No chest wall deformity Neuro: No dizziness or lightheadedness.  Psych: No depression or anxiety. Mood stable.     Physical Exam:  BP 120/70 (BP Location: Left Arm)   Pulse 60   Ht 5\' 5"  (1.651 m)   Wt 203 lb 9.6 oz (92.4 kg)   SpO2 98%   BMI 33.88 kg/m   GEN: Pleasant, interactive, well-appearing; obese; in no acute distress. HEENT:  Normocephalic and atraumatic. PERRLA. Sclera white. Nasal turbinates pink, moist and patent bilaterally. No rhinorrhea present. Oropharynx pink and moist, without exudate or edema. No lesions, ulcerations, or postnasal drip. Mallampati II NECK:  Supple w/ fair ROM. Thyroid symmetrical with no goiter or nodules palpated. No lymphadenopathy.   CV: RRR, no m/r/g, no peripheral edema. Pulses intact, +2 bilaterally. No cyanosis, pallor or clubbing. PULMONARY:  Unlabored, regular breathing. Clear bilaterally A&P w/o wheezes/rales/rhonchi. No accessory muscle use.  GI: BS present and normoactive. Soft, non-tender to palpation. No organomegaly or masses detected.  MSK: No erythema, warmth or tenderness.  No deformities or joint swelling noted.  Neuro: A/Ox3. No focal deficits noted.   Skin: Warm, no lesions or rashe Psych: Normal affect and behavior. Judgement and thought content appropriate.     Lab Results:  CBC    Component Value Date/Time   WBC 6.9 05/02/2022 0748   RBC 4.36 05/02/2022 0748   HGB 12.9 05/02/2022 0748   HCT 39.1 05/02/2022 0748   PLT 240.0 05/02/2022 0748   MCV 89.6 05/02/2022 0748   MCHC 33.1 05/02/2022 0748   RDW 13.3 05/02/2022 0748   LYMPHSABS 1.9 05/02/2022 0748   MONOABS 0.6 05/02/2022 0748   EOSABS 0.1 05/02/2022 0748   BASOSABS 0.1 05/02/2022 0748    BMET    Component Value  Date/Time   NA 138 05/02/2022 0748   K 3.9 05/02/2022 0748   CL 104 05/02/2022 0748   CO2 27 05/02/2022 0748   GLUCOSE 113 (H) 05/02/2022 0748   BUN 11 05/02/2022 0748   CREATININE 0.89 05/02/2022 0748   CALCIUM 9.5 05/02/2022 0748    BNP No results found for: "BNP"   Imaging:  ECHOCARDIOGRAM COMPLETE  Result Date: 08/03/2022    ECHOCARDIOGRAM REPORT   Patient Name:   HEND MCCARRELL Nwosu Date of Exam: 08/03/2022 Medical Rec #:  08/05/2022         Height:       65.0 in Accession #:    [id number]        Weight:       202.2 lb Date of Birth:  [birth year]         BSA:  1.987 m Patient Age:    61 years          BP:           138/82 mmHg Patient Gender: F                 HR:           73 bpm. Exam Location:  [facility] Procedure: 2D Echo, Cardiac Doppler and Color Doppler Indications:     Dyspnea R06.00  History:         Patient has no prior history of Echocardiogram examinations.                  Risk Factors:Hypertension and Sleep Apnea. Migraine.  Sonographer:     Cristela BlueJerry Hege Referring Phys:  [id number] Pasty SpillersRACY N MCLEAN-SCOCUZZA Diagnosing Phys: Julien Nordmannimothy Gollan MD  Sonographer Comments: Suboptimal parasternal window. IMPRESSIONS  1. Left ventricular ejection fraction, by estimation, is 60 to 65%. The left ventricle has normal function. The left ventricle has no regional wall motion abnormalities. Left ventricular diastolic parameters are consistent with Grade I diastolic dysfunction (impaired relaxation).  2. Right ventricular systolic function is normal. The right ventricular size is normal. There is normal pulmonary artery systolic pressure. The estimated right ventricular systolic pressure is 22.6 mmHg.  3. The mitral valve is normal in structure. Mild mitral valve regurgitation. No evidence of mitral stenosis.  4. The aortic valve was not well visualized. Aortic valve regurgitation is not visualized. No aortic stenosis is present.  5. The inferior vena cava is normal in size with greater than 50% respiratory  variability, suggesting right atrial pressure of 3 mmHg. FINDINGS  Left Ventricle: Left ventricular ejection fraction, by estimation, is 60 to 65%. The left ventricle has normal function. The left ventricle has no regional wall motion abnormalities. The left ventricular internal cavity size was normal in size. There is  no left ventricular hypertrophy. Left ventricular diastolic parameters are consistent with Grade I diastolic dysfunction (impaired relaxation). Right Ventricle: The right ventricular size is normal. No increase in right ventricular wall thickness. Right ventricular systolic function is normal. There is normal pulmonary artery systolic pressure. The tricuspid regurgitant velocity is 2.10 m/s, and  with an assumed right atrial pressure of 5 mmHg, the estimated right ventricular systolic pressure is 22.6 mmHg. Left Atrium: Left atrial size was normal in size. Right Atrium: Right atrial size was normal in size. Pericardium: There is no evidence of pericardial effusion. Mitral Valve: The mitral valve is normal in structure. There is mild calcification of the mitral valve leaflet(s). Mild mitral valve regurgitation. No evidence of mitral valve stenosis. Tricuspid Valve: The tricuspid valve is normal in structure. Tricuspid valve regurgitation is mild . No evidence of tricuspid stenosis. Aortic Valve: The aortic valve was not well visualized. Aortic valve regurgitation is not visualized. No aortic stenosis is present. Aortic valve mean gradient measures 4.0 mmHg. Aortic valve peak gradient measures 8.2 mmHg. Aortic valve area, by VTI measures 2.41 cm. Pulmonic Valve: The pulmonic valve was normal in structure. Pulmonic valve regurgitation is not visualized. No evidence of pulmonic stenosis. Aorta: The aortic root is normal in size and structure. Venous: The inferior vena cava is normal in size with greater than 50% respiratory variability, suggesting right atrial pressure of 3 mmHg. IAS/Shunts: No atrial  level shunt detected by color flow Doppler.  LEFT VENTRICLE PLAX 2D LVIDd:         4.40 cm   Diastology LVIDs:  3.10 cm   LV e' medial:    7.29 cm/s LV PW:         1.10 cm   LV E/e' medial:  10.8 LV IVS:        0.90 cm   LV e' lateral:   8.70 cm/s LVOT diam:     2.10 cm   LV E/e' lateral: 9.0 LV SV:         83 LV SV Index:   42 LVOT Area:     3.46 cm  RIGHT VENTRICLE RV Basal diam:  3.10 cm RV Mid diam:    2.10 cm RV S prime:     12.10 cm/s TAPSE (M-mode): 2.6 cm LEFT ATRIUM             Index        RIGHT ATRIUM           Index LA diam:        2.90 cm 1.46 cm/m   RA Area:     15.20 cm LA Vol (A2C):   40.4 ml 20.33 ml/m  RA Volume:   35.20 ml  17.71 ml/m LA Vol (A4C):   31.8 ml 16.00 ml/m LA Biplane Vol: 37.3 ml 18.77 ml/m  AORTIC VALVE AV Area (Vmax):    2.47 cm AV Area (Vmean):   2.43 cm AV Area (VTI):     2.41 cm AV Vmax:           143.00 cm/s AV Vmean:          96.200 cm/s AV VTI:            0.343 m AV Peak Grad:      8.2 mmHg AV Mean Grad:      4.0 mmHg LVOT Vmax:         102.00 cm/s LVOT Vmean:        67.600 cm/s LVOT VTI:          0.239 m LVOT/AV VTI ratio: 0.70  AORTA Ao Root diam: 2.20 cm MITRAL VALVE               TRICUSPID VALVE MV Area (PHT): 3.60 cm    TR Peak grad:   17.6 mmHg MV Decel Time: 211 msec    TR Vmax:        210.00 cm/s MV E velocity: 78.60 cm/s MV A velocity: 80.70 cm/s  SHUNTS MV E/A ratio:  0.97        Systemic VTI:  0.24 m                            Systemic Diam: 2.10 cm Julien Nordmann MD Electronically signed by Julien Nordmann MD Signature Date/Time: 08/03/2022/4:44:05 PM    Final           No data to display          No results found for: "NITRICOXIDE"      Assessment & Plan:   OSA on CPAP Severe OSA with RDI 50/hr in 2017. She has been on CPAP since. Her machine broke around 2 months ago after she tried adding a heated hose to it. She has been unable to use it since. She was told by her medical supply company that it would not be worth fixing, due  to age of the machine, and she should request a new one. She was compliant with use otherwise and received benefit from use. Orders placed today for  new PAP machine auto 5-15, mask of choice and heated humidification. Encouraged her to try saline nasal gel and/or flonase for nasal congestion from use.   Patient Instructions  Continue to use CPAP every night, minimum of 4-6 hours a night.  Change equipment every 30 days or as directed by DME. Wash your tubing with warm soap and water daily, hang to dry. Wash humidifier portion weekly.  Be aware of reduced alertness and do not drive or operate heavy machinery if experiencing this or drowsiness.  Exercise encouraged, as tolerated. Avoid or decrease alcohol consumption and medications that make you more sleepy, if possible. Notify if persistent daytime sleepiness occurs even with consistent use of CPAP.  Follow up in 10-12 weeks to see how your new machine is working with Dr. Vassie Loll or Florentina Addison Murl Zogg,NP or sooner if needed      Obesity (BMI 30.0-34.9) Healthy weight loss encouraged.    I spent 28 minutes of dedicated to the care of this patient on the date of this encounter to include pre-visit review of records, face-to-face time with the patient discussing conditions above, post visit ordering of testing, clinical documentation with the electronic health record, making appropriate referrals as documented, and communicating necessary findings to members of the patients care team.  Noemi Chapel, NP 08/20/2022  Pt aware and understands NP's role.

## 2022-09-03 NOTE — Telephone Encounter (Signed)
I called [facility] and they do not show an order for this patient CPAP. Can we re-fax please? Thanks

## 2022-09-03 NOTE — Telephone Encounter (Signed)
I sent urgent CM to [place] order was placed on 10/9 but doesn't look like requesting replacement cpap

## 2022-09-10 NOTE — Telephone Encounter (Signed)
Rec'd confirmation from [facility] that she has this order

## 2022-09-17 DIAGNOSIS — G4733 Obstructive sleep apnea (adult) (pediatric): Secondary | ICD-10-CM | POA: Diagnosis not present

## 2022-09-20 ENCOUNTER — Ambulatory Visit: Payer: BC Managed Care – PPO | Admitting: Internal Medicine

## 2022-09-21 ENCOUNTER — Encounter: Payer: Self-pay | Admitting: Family Medicine

## 2022-09-25 ENCOUNTER — Other Ambulatory Visit: Payer: Self-pay

## 2022-09-25 ENCOUNTER — Encounter: Payer: Self-pay | Admitting: Internal Medicine

## 2022-09-25 ENCOUNTER — Encounter: Payer: Self-pay | Admitting: Family Medicine

## 2022-09-25 DIAGNOSIS — Z1231 Encounter for screening mammogram for malignant neoplasm of breast: Secondary | ICD-10-CM

## 2022-09-26 ENCOUNTER — Other Ambulatory Visit: Payer: Self-pay

## 2022-09-26 DIAGNOSIS — Z1231 Encounter for screening mammogram for malignant neoplasm of breast: Secondary | ICD-10-CM

## 2022-10-18 ENCOUNTER — Ambulatory Visit: Payer: BC Managed Care – PPO | Admitting: Student in an Organized Health Care Education/Training Program

## 2022-10-24 ENCOUNTER — Ambulatory Visit: Payer: 59 | Admitting: Family Medicine

## 2022-10-24 ENCOUNTER — Encounter: Payer: Self-pay | Admitting: Family Medicine

## 2022-10-24 DIAGNOSIS — I10 Essential (primary) hypertension: Secondary | ICD-10-CM

## 2022-10-24 DIAGNOSIS — R7303 Prediabetes: Secondary | ICD-10-CM

## 2022-10-24 DIAGNOSIS — E559 Vitamin D deficiency, unspecified: Secondary | ICD-10-CM | POA: Diagnosis not present

## 2022-10-24 DIAGNOSIS — G4733 Obstructive sleep apnea (adult) (pediatric): Secondary | ICD-10-CM | POA: Diagnosis not present

## 2022-10-24 DIAGNOSIS — Z0000 Encounter for general adult medical examination without abnormal findings: Secondary | ICD-10-CM

## 2022-10-24 DIAGNOSIS — E669 Obesity, unspecified: Secondary | ICD-10-CM

## 2022-10-24 LAB — BASIC METABOLIC PANEL
BUN: 13 mg/dL (ref 6–23)
CO2: 30 mEq/L (ref 19–32)
Calcium: 9.9 mg/dL (ref 8.4–10.5)
Chloride: 106 mEq/L (ref 96–112)
Creatinine, Ser: 0.96 mg/dL (ref 0.40–1.20)
GFR: 62.87 mL/min (ref >60.00)
Glucose, Bld: 88 mg/dL (ref 70–99)
Potassium: 4.6 mEq/L (ref 3.5–5.1)
Sodium: 143 mEq/L (ref 135–145)

## 2022-10-24 LAB — HEMOGLOBIN A1C: Hgb A1c MFr Bld: 6 % (ref 4.6–6.5)

## 2022-10-24 MED ORDER — VALSARTAN 40 MG PO TABS: 40.0000 mg | ORAL_TABLET | Freq: Every day | ORAL | 0 refills | Status: DC

## 2022-10-24 NOTE — Patient Instructions (Addendum)
It was a pleasure meeting you today. Thank you for allowing me to take part in your health care.  Our goals for today as we discussed include:  For your blood pressure Stop Bystolic Start valsartan 40 mg daily Will do labs today Please schedule have blood work in 1 week after starting valsartan. MyChart be the results of your blood pressures at home in the next week after starting new medication.  Goal of blood pressure less than 130/80.   Will check A1c   Schedule fasting lab appointment 1 week prior to your next annual visit.  Schedule PCP appt in 4-6 weeks for follow up blood pressure   If you have any questions or concerns, please do not hesitate to call the office at [phone].  I look forward to our next visit and until then take care and stay safe.  Regards,   Dana Allan, MD   [facility]

## 2022-10-24 NOTE — Progress Notes (Signed)
SUBJECTIVE:   Chief Complaint  Patient presents with   Establish Care    Transfer of Care   HPI Patient presents to clinic to transfer care.  No acute concerns today.  Hypertension Asymptomatic.  Currently on Bystolic 5 mg daily.  Not sure why she is switched to this medication.  Would like to know if she could come off medication.  Has changed diet and increased exercise.  Weight loss Was unable to obtain [facility] medication secondary to manufacture backorder.  Has since been monitoring diet and increasing exercise.  Plans to continue current regime.  Recently had life screening imaging  PERTINENT PMH / PSH: Hypertension OSA on CPAP Prediabetes Obesity class I  OBJECTIVE:  BP 130/84   Pulse (!) 56   Temp (!) 97.5 F (36.4 C)   Ht 5\' 5"  (1.651 m)   Wt 193 lb (87.5 kg)   SpO2 99%   BMI 32.12 kg/m    Physical Exam Vitals reviewed.  Constitutional:      General: She is not in acute distress.    Appearance: She is not ill-appearing.  HENT:     Head: Normocephalic.     Nose: Nose normal.  Eyes:     Conjunctiva/sclera: Conjunctivae normal.  Cardiovascular:     Rate and Rhythm: Normal rate and regular rhythm.     Heart sounds: Normal heart sounds.  Pulmonary:     Effort: Pulmonary effort is normal.     Breath sounds: Normal breath sounds.  Abdominal:     General: Abdomen is flat. Bowel sounds are normal.     Palpations: Abdomen is soft.  Musculoskeletal:        General: Normal range of motion.     Cervical back: Normal range of motion.  Skin:    General: Skin is warm and dry.  Neurological:     Mental Status: She is alert and oriented to person, place, and time. Mental status is at baseline.  Psychiatric:        Mood and Affect: Mood normal.        Behavior: Behavior normal.        Thought Content: Thought content normal.        Judgment: Judgment normal.     ASSESSMENT/PLAN:  Essential hypertension Assessment & Plan: Chronic.  Stable.  Slightly  elevated today 130/84.  Currently on Bystolic 5 mg daily. Will discontinue Bystolic given bradycardia Start valsartan 40 mg daily Bmet today Repeat bmet in 1 week after initiation of valsartan. Monitor blood pressure at home, if elevated greater than 140/90 will increase to 80 mg daily. Follow-up with 4 to 6 weeks Strict return precautions provided  Orders: -     Comprehensive metabolic panel; Future -     TSH; Future -     Valsartan; Take 1 tablet (40 mg total) by mouth daily.  Dispense: 30 tablet; Refill: 0 -     Basic metabolic panel; Future -     Basic metabolic panel  Prediabetes -     Hemoglobin A1c; Future -     Hemoglobin A1c  OSA on CPAP Assessment & Plan: Chronic.  Stable.  Uses CPAP Follows with pulmonology.   Vitamin D deficiency -     VITAMIN D 25 Hydroxy (Vit-D Deficiency, Fractures); Future  Obesity (BMI 30-39.9) -     CBC with Differential/Platelet; Future -     Lipid panel; Future -     Vitamin B12; Future  HCM Patient recent [facility] screening results  AAA screening-abdominal aortic aneurysm less than 3 cm A-fib screening-no atrial fibrillation seen/heart rate 55 bpm CAD screening-left PSV: 11-125 cm/s; right PSV: 11-125 cm/sec.  Results indicating mild CAD risk bilateral carotid arteries High sensitive C-reactive protein -CRP 7.9.  Parameters indicate high sensitive CRP high risk 3.01 mg/L or higher Osteoporosis risk analysis: Low probability of osteoporosis-0.844-1.4 g/centimeters2 PAD screening- left: 1.04; right: 1.01.  PAD risk normal 0.9-1.4  PDMP reviewed  Return in about 5 months (around 03/25/2023) for annual visit with fasting labs 1 week prior.  Schedule PCP appt in 4-6 weeks f/u BP Needs lab appointment in 1 week  Dana Allan, MD

## 2022-10-25 ENCOUNTER — Telehealth: Payer: Self-pay

## 2022-10-25 NOTE — Telephone Encounter (Signed)
Spoke with patient about getting a download from her CPAP. She does not have an SD card. She said [employer] told her to reach out to them 3 days prior to her appt so they could send it over. She will contact them today. I told her I will contact them tomorrow afternoon if I have not received it.

## 2022-10-26 ENCOUNTER — Encounter: Payer: Self-pay | Admitting: Family Medicine

## 2022-10-26 NOTE — Telephone Encounter (Signed)
I spoke with Aurther Loft and [place]. She will fax over the patient's report.

## 2022-10-29 ENCOUNTER — Encounter: Payer: Self-pay | Admitting: Internal Medicine

## 2022-10-29 ENCOUNTER — Ambulatory Visit: Payer: 59 | Admitting: Internal Medicine

## 2022-10-29 VITALS — BP 120/84 | HR 72 | Temp 98.0°F | Ht 65.0 in | Wt 192.8 lb

## 2022-10-29 DIAGNOSIS — G4733 Obstructive sleep apnea (adult) (pediatric): Secondary | ICD-10-CM | POA: Diagnosis not present

## 2022-10-29 NOTE — Patient Instructions (Signed)
Excellent Job!! A+  Continue to use CPAP as directed  Recommend weight loss   Encouraged proper weight management.  Important to get eight or more hours of sleep  Limiting the use of the computer and television before bedtime.  Decrease naps during the day, so night time sleep will become enhanced.  Limit caffeine, and sleep deprivation.  HTN, stroke, uncontrolled diabetes and heart failure are potential risk factors.  Risk of untreated sleep apnea including cardiac arrhthymias, stroke, DM, pulm HTN.

## 2022-10-29 NOTE — Progress Notes (Signed)
Name: Nina Bryant MRN: [id number] DOB: [birth year]    CHIEF COMPLAINT:  EXCESSIVE DAYTIME SLEEPINESS Patient diagnosed with severe sleep apnea   HISTORY OF PRESENT ILLNESS: Patient is seen today for problems and issues with sleep related to excessive daytime sleepiness Patient  has been having sleep problems for many years Patient has been having excessive daytime sleepiness for a long time Patient has been having extreme fatigue and tiredness, lack of energy +  very Loud snoring every night + struggling breathe at night and gasps for air + morning headaches + Nonrefreshing sleep  Patient diagnosed with severe sleep apnea AHI of 28 Patient started using CPAP machine recently Compliance report reviewed in detail with patient Patient has excellent compliance report and sleep apnea is well-controlled    Discussed sleep data and reviewed with patient.  Encouraged proper weight management.  Discussed driving precautions and its relationship with hypersomnolence.  Discussed operating dangerous equipment and its relationship with hypersomnolence.  Discussed sleep hygiene, and benefits of a fixed sleep waked time.  The importance of getting eight or more hours of sleep discussed with patient.  Discussed limiting the use of the computer and television before bedtime.  Decrease naps during the day, so night time sleep will become enhanced.  Limit caffeine, and sleep deprivation.  HTN, stroke, and heart failure are potential risk factors.   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  has a past surgical history that includes Abdominal hysterectomy. Prior to Admission  medications   Medication Sig Start Date End Date Taking? Authorizing Provider  Multiple Vitamin (MULTIVITAMIN) capsule Take 1 capsule by mouth daily.   Yes [provider]  valsartan (DIOVAN) 40 MG tablet Take 1 tablet (40 mg total) by mouth daily. 10/24/22  Yes Dana Allan, MD  fluticasone (FLONASE) 50 MCG/ACT nasal spray Place 2 sprays into both nostrils daily. prn Patient not taking: Reported on 10/24/2022 07/18/22   McLean-Scocuzza, Pasty Spillers, MD  sodium chloride (OCEAN) 0.65 % SOLN nasal spray Place 1 spray into both nostrils as needed for congestion. Patient not taking: Reported on 10/24/2022 07/18/22   McLean-Scocuzza, Pasty Spillers, MD   Allergies  Allergen Reactions   Hydrochlorothiazide     AKI    FAMILY HISTORY:  family history includes Cancer in her maternal grandmother; Diabetes in her father and mother; Heart disease in her mother; Other in her brother and sister; Seizures in her father; Stroke in her father. SOCIAL HISTORY:  reports that she has never smoked. She has never used smokeless tobacco. She reports current alcohol use.   Review of Systems:  Gen:  Denies  fever, sweats, chills weight loss  HEENT: Denies blurred vision, double vision, ear pain, eye pain, hearing loss, nose bleeds, sore throat Cardiac:  No dizziness, chest pain or heaviness, chest tightness,edema, No JVD Resp:   No cough, -sputum production, -shortness of breath,-wheezing, -hemoptysis,  Gi: Denies swallowing difficulty, stomach pain, nausea or vomiting, diarrhea, constipation, bowel incontinence Gu:  Denies bladder incontinence, burning urine Ext:   Denies Joint pain, stiffness or swelling Skin: Denies  skin rash, easy bruising or bleeding or hives Endoc:  Denies polyuria, polydipsia , polyphagia or weight change Psych:   Denies depression, insomnia or hallucinations  Other:  All other systems negative   ALL OTHER ROS ARE NEGATIVE   BP 120/84 (BP Location: Left Arm, Cuff Size: Normal)    Pulse 72   Temp 98 F (36.7 C)   Ht 5\' 5"  (1.651 m)   Wt 192 lb 12.8 oz (87.5 kg)   SpO2 97%   BMI 32.08 kg/m     Physical Examination:   General Appearance: No distress  EYES PERRLA, EOM intact.   NECK Supple, No JVD Pulmonary: normal breath sounds, No wheezing.  CardiovascularNormal S1,S2.  No m/r/g.   Abdomen: Benign, Soft, non-tender. Skin:   warm, no rashes, no ecchymosis  Extremities: normal, no cyanosis, clubbing. Neuro:without focal findings,  speech normal  PSYCHIATRIC: Mood, affect within normal limits.   ALL OTHER ROS ARE NEGATIVE    ASSESSMENT AND PLAN SYNOPSIS  61 year old pleasant African-American female seen today for assessment of severe sleep apnea with AHI of 28  Patient received new CPAP machine several months ago Compliance report reviewed in detail with patient Patient shows excellent compliance for days and greater than 4 hours at 100% AHI reduced to 0.9, auto CPAP 5-15 nasal pillows Patient benefits from CPAP therapy  Obesity -recommend significant weight loss -recommend changing diet  Deconditioned state -Recommend increased daily activity and exercise   MEDICATION ADJUSTMENTS/LABS AND TESTS ORDERED:   Continue to use CPAP as directed  Recommend weight loss   Encouraged proper weight management.  Important to get eight or more hours of sleep  Limiting the use of the computer and television before bedtime.  Decrease naps during the day, so night time sleep will become enhanced.  Limit caffeine, and sleep deprivation.  HTN, stroke, uncontrolled diabetes and heart failure are potential risk factors.  Risk of untreated sleep apnea including cardiac arrhthymias, stroke, DM, pulm HTN.    CURRENT MEDICATIONS REVIEWED AT LENGTH WITH PATIENT TODAY   Patient  satisfied with Plan of action and management. All questions answered  Follow up 1 year  Total Time Spent  35 mins   Legion Discher 62, M.D.  [facility]  Medical Director [facility] Medical Director [facility] Cardio-Pulmonary Department

## 2022-10-31 ENCOUNTER — Other Ambulatory Visit (INDEPENDENT_AMBULATORY_CARE_PROVIDER_SITE_OTHER): Payer: 59

## 2022-10-31 DIAGNOSIS — Z0000 Encounter for general adult medical examination without abnormal findings: Secondary | ICD-10-CM

## 2022-10-31 LAB — CBC WITH DIFFERENTIAL/PLATELET
Basophils Absolute: 0 10*3/uL (ref 0.0–0.1)
Basophils Relative: 0.8 % (ref 0.0–3.0)
Eosinophils Absolute: 0.2 10*3/uL (ref 0.0–0.7)
Eosinophils Relative: 3.4 % (ref 0.0–5.0)
HCT: 38.4 % (ref 36.0–46.0)
Hemoglobin: 12.9 g/dL (ref 12.0–15.0)
Lymphocytes Relative: 31.3 % (ref 12.0–46.0)
Lymphs Abs: 1.6 10*3/uL (ref 0.7–4.0)
MCHC: 33.5 g/dL (ref 30.0–36.0)
MCV: 88.4 fl (ref 78.0–100.0)
Monocytes Absolute: 0.5 10*3/uL (ref 0.1–1.0)
Monocytes Relative: 10.6 % (ref 3.0–12.0)
Neutro Abs: 2.8 10*3/uL (ref 1.4–7.7)
Neutrophils Relative %: 53.9 % (ref 43.0–77.0)
Platelets: 211 10*3/uL (ref 150.0–400.0)
RBC: 4.34 Mil/uL (ref 3.87–5.11)
RDW: 13.6 % (ref 11.5–15.5)
WBC: 5.1 10*3/uL (ref 4.0–10.5)

## 2022-10-31 LAB — COMPREHENSIVE METABOLIC PANEL
ALT: 14 U/L (ref 0–35)
AST: 16 U/L (ref 0–37)
Albumin: 4.4 g/dL (ref 3.5–5.2)
Alkaline Phosphatase: 80 U/L (ref 39–117)
BUN: 12 mg/dL (ref 6–23)
CO2: 27 mEq/L (ref 19–32)
Calcium: 9.5 mg/dL (ref 8.4–10.5)
Chloride: 105 mEq/L (ref 96–112)
Creatinine, Ser: 0.95 mg/dL (ref 0.40–1.20)
GFR: 63.66 mL/min (ref >60.00)
Glucose, Bld: 108 mg/dL — ABNORMAL HIGH (ref 70–99)
Potassium: 4.3 mEq/L (ref 3.5–5.1)
Sodium: 141 mEq/L (ref 135–145)
Total Bilirubin: 0.6 mg/dL (ref 0.2–1.2)
Total Protein: 6.8 g/dL (ref 6.0–8.3)

## 2022-10-31 LAB — LIPID PANEL
Cholesterol: 197 mg/dL (ref 0–200)
HDL: 45.3 mg/dL (ref >39.00)
LDL Cholesterol: 125 mg/dL — ABNORMAL HIGH (ref 0–99)
NonHDL: 151.56
Total CHOL/HDL Ratio: 4
Triglycerides: 133 mg/dL (ref 0.0–149.0)
VLDL: 26.6 mg/dL (ref 0.0–40.0)

## 2022-10-31 LAB — VITAMIN B12: Vitamin B-12: 603 pg/mL (ref 211–911)

## 2022-10-31 LAB — HEMOGLOBIN A1C: Hgb A1c MFr Bld: 5.9 % (ref 4.6–6.5)

## 2022-10-31 LAB — VITAMIN D 25 HYDROXY (VIT D DEFICIENCY, FRACTURES): VITD: 33.15 ng/mL (ref 30.00–100.00)

## 2022-10-31 LAB — TSH: TSH: 1.59 u[IU]/mL (ref 0.35–5.50)

## 2022-10-31 NOTE — Telephone Encounter (Signed)
See message with attached result.

## 2022-11-08 ENCOUNTER — Encounter: Payer: Self-pay | Admitting: Family Medicine

## 2022-11-08 ENCOUNTER — Ambulatory Visit
Admission: RE | Admit: 2022-11-08 | Discharge: 2022-11-08 | Disposition: A | Discharge status: Home / Self Care | Payer: No Typology Code available for payment source | Source: Ambulatory Visit | Attending: Family Medicine | Admitting: Family Medicine

## 2022-11-08 DIAGNOSIS — Z1231 Encounter for screening mammogram for malignant neoplasm of breast: Secondary | ICD-10-CM | POA: Insufficient documentation

## 2022-11-08 NOTE — Telephone Encounter (Signed)
Schedule for RN visit BP check and bring in bp monitor. Need to schedule M-Thurs.  If remains elevated can increase Valsartan to 80 mg daily

## 2022-11-09 NOTE — Telephone Encounter (Signed)
Patient is scheduled for a nurse visit for BP check with her monitor also on 11/19/22 at 11:15

## 2022-11-12 ENCOUNTER — Encounter: Payer: Self-pay | Admitting: Family Medicine

## 2022-11-12 NOTE — Assessment & Plan Note (Signed)
Chronic.  Stable.  Slightly elevated today 130/84.  Currently on Bystolic 5 mg daily. Will discontinue Bystolic given bradycardia Start valsartan 40 mg daily Bmet today Repeat bmet in 1 week after initiation of valsartan. Monitor blood pressure at home, if elevated greater than 140/90 will increase to 80 mg daily. Follow-up with 4 to 6 weeks Strict return precautions provided

## 2022-11-12 NOTE — Assessment & Plan Note (Signed)
Chronic. Stable. Uses CPAP. Follows with pulmonology.

## 2022-11-19 ENCOUNTER — Ambulatory Visit (INDEPENDENT_AMBULATORY_CARE_PROVIDER_SITE_OTHER): Payer: No Typology Code available for payment source

## 2022-11-19 ENCOUNTER — Other Ambulatory Visit: Payer: Self-pay | Admitting: Family Medicine

## 2022-11-19 VITALS — BP 150/82

## 2022-11-19 DIAGNOSIS — I10 Essential (primary) hypertension: Secondary | ICD-10-CM

## 2022-11-19 MED ORDER — VALSARTAN 80 MG PO TABS: 80.0000 mg | ORAL_TABLET | Freq: Every day | ORAL | 0 refills | Status: DC

## 2022-11-19 NOTE — Progress Notes (Signed)
Patient here for nurse visit BP check per order from Dr. Carollee Leitz.   Patient reports compliance with prescribed BP medications: yes, took Valsartan last night at 9:30p  Last dose of BP medication: 11/18/22 @ 9:30p  BP Readings from Last 3 Encounters:  11/19/22 (!) 150/82  10/29/22 120/84  10/24/22 130/84   Pulse Readings from Last 3 Encounters:  10/29/22 72  10/24/22 (!) 56  08/20/22 60    Per Dr. Carollee Leitz: Increase Valsartan to 80mg  and come back in 1 week for nurse BP check.  Nurse visit scheduled for 11/27/22 @ 4pm.   Patient verbalized understanding of instructions.   Ferne Reus, RN

## 2022-11-19 NOTE — Progress Notes (Signed)
BP remains elevated 148/80's during RN clinic.  Increase Valsartan to 80 mg daily Follow up in 1 week for repeat BP check in RN clinic If any increased headaches, visual changes or weakness, dizziness call 911 or have someone take her to ED for evaluation  Carollee Leitz, MD

## 2022-11-21 ENCOUNTER — Encounter: Payer: Self-pay | Admitting: Family Medicine

## 2022-11-23 MED ORDER — NEBIVOLOL HCL 5 MG PO TABS: 5.0000 mg | ORAL_TABLET | Freq: Every day | ORAL | 3 refills | Status: DC

## 2022-11-27 ENCOUNTER — Ambulatory Visit: Payer: No Typology Code available for payment source

## 2022-11-27 NOTE — Progress Notes (Signed)
Patient presented for BP check and  her BP was taken in her left arm and the readings were 142/84 O2 was 96 Hr 81...with a manual cuff and took it again with her cuff from home and it  was 175 /84 in left arm  O2 was 98 and hr was 83 but her BP machine was old and she does not have the proper size cuff I advised her to order another  machine or  the proper size cuff.Pt also stated that her BP is normally around 423-536 Systolic on a regular basis.

## 2022-11-29 ENCOUNTER — Telehealth: Payer: Self-pay | Admitting: Internal Medicine

## 2022-11-29 ENCOUNTER — Other Ambulatory Visit: Payer: Self-pay | Admitting: Family Medicine

## 2022-11-29 NOTE — Telephone Encounter (Signed)
Ms Pless came in 11/29/22 for blood pressure check.  I am forwarding the information to you - to determine if further mediation adjustment warranted.  Attached is the message that I received.  I have signed off on the note.   "Patient presented for BP check and  her BP was taken in her left arm and the readings were 142/84 O2 was 96 Hr 81...with a manual cuff and took it again with her cuff from home and it  was 175 /84 in left arm  O2 was 98 and hr was 83 but her BP machine was old and she does not have the proper size cuff I advised her to order another  machine or  the proper size cuff.Pt also stated that her BP is normally around 563-875 Systolic on a regular basis. "

## 2022-12-03 NOTE — Telephone Encounter (Signed)
Patient is scheduled for 12/13/22 at 8:00 for BP. Does she need to come in before that?

## 2022-12-04 NOTE — Telephone Encounter (Signed)
LM for pt to cb

## 2022-12-05 NOTE — Telephone Encounter (Signed)
Patient stated she started the Bystolic the day you advised her too. Patient is not on Valsartan. Patient also stated she will get the nurse at work to check her BP today due to her machine being 30 points off.

## 2022-12-07 ENCOUNTER — Encounter: Payer: Self-pay | Admitting: Pharmacist

## 2022-12-12 NOTE — Congregational Nurse Program (Signed)
  Dept: [place] [facility] Nurse Program Note  Date of Encounter: 12/12/2022  Clinic visit to check blood pressure, BP 138/78 is lower than on previous weekly check.  Pulse 78 and regular, O2 Sat 98%.  Educated regarding strategies to decrease amount of sweets and desserts she is eating.  Given tips for breakfast foods that are not high in added sugars. Past Medical History: Past Medical History:  Diagnosis Date   Annual physical exam 03/11/2020   Anxiety and depression 03/11/2020   Arthritis    left shoulder   BMI 32.0-32.9,adult 03/20/2022   Cataract    ? which eye mild    Cervical arthritis 03/15/2021   03/15/21      COVID-19    11/04/21 sneezing   Diabetes mellitus without complication (HCC)    GERD (gastroesophageal reflux disease)    History of migraine 07/18/2022   Hypertension    Menopause 03/11/2020   Migraines    OSA on CPAP    Prediabetes    Sleep apnea    Sneezing 02/04/2019   UTI (urinary tract infection)     Encounter Details:  CNP Questionnaire - 12/12/22 1445       Questionnaire   Ask client: Do you give verbal consent for me to treat you today? Yes    Student Assistance N/A    Location Patient [facility]    Visit Setting with Client Organization    Patient Status Unknown   Has own home in [place], [place] or [place]    Insurance/Financial Assistance Referral N/A    Medication N/A    Medical Provider Yes    Screening Referrals Made N/A    Medical Referrals Made N/A    Medical Appointment Made N/A    Recently w/o PCP, now 1st time PCP visit completed due to CNs referral or appointment made N/A    Food N/A    Transportation N/A    Housing/Utilities N/A    Interpersonal Safety N/A    Interventions Counsel;Educate;Advocate/Support    Abnormal to Normal Screening Since Last CN Visit N/A    Screenings CN Performed Blood Pressure;Pulse Ox    Sent Client to Lab for: N/A    Did client attend any of the  following based off CNs referral or appointments made? N/A    ED Visit Averted N/A    Life-Saving Intervention Made N/A

## 2022-12-13 ENCOUNTER — Encounter: Payer: Self-pay | Admitting: Family Medicine

## 2022-12-13 ENCOUNTER — Ambulatory Visit: Payer: No Typology Code available for payment source | Admitting: Family Medicine

## 2022-12-13 VITALS — BP 120/70 | HR 61 | Temp 98.1°F | Resp 17 | Ht 65.0 in | Wt 190.4 lb

## 2022-12-13 DIAGNOSIS — E785 Hyperlipidemia, unspecified: Secondary | ICD-10-CM | POA: Diagnosis not present

## 2022-12-13 DIAGNOSIS — E669 Obesity, unspecified: Secondary | ICD-10-CM | POA: Diagnosis not present

## 2022-12-13 DIAGNOSIS — E559 Vitamin D deficiency, unspecified: Secondary | ICD-10-CM

## 2022-12-13 DIAGNOSIS — R7303 Prediabetes: Secondary | ICD-10-CM

## 2022-12-13 DIAGNOSIS — I10 Essential (primary) hypertension: Secondary | ICD-10-CM

## 2022-12-13 NOTE — Patient Instructions (Addendum)
It was a pleasure meeting you today. Thank you for allowing me to take part in your health care.  Our goals for today as we discussed include:  Continue Bystolic 5 mg daily Measure blood pressure at home.  Goal less than 130/80.  Follow-up with PCP as scheduled May 13. Lab appointment May 6 for fasting labs.   If you have any questions or concerns, please do not hesitate to call the office at [phone].  I look forward to our next visit and until then take care and stay safe.  Regards,   Carollee Leitz, MD   [facility]

## 2022-12-13 NOTE — Assessment & Plan Note (Addendum)
Chronic.  Stable.  Well-controlled Discontinued valsartan Continue Bystolic 5 mg daily Monitor blood pressure at home.  Goal less than 130/80 Follow-up as scheduled

## 2022-12-13 NOTE — Progress Notes (Signed)
   SUBJECTIVE:   Chief Complaint  Patient presents with   Follow-up    BP follow-up   HPI Patient presents to clinic for hypertension follow-up. Seen in clinic 53/61 and Bystolic changed to Diovan 40 mg daily for bradycardia.  Blood pressure remained elevated.  Increase Diovan to 80 mg daily and continue to have high blood pressure.  Switch back to Bystolic 5 mg on 44/31/54.  Since that time blood pressure has remained less than 130/80.  Tolerating blood pressure well and has not been bradycardic.  Denies any chest pain, shortness of breath or lower extremity edema.   PERTINENT PMH / PSH: Hypertension Osteopenia Prediabetes Obesity   OBJECTIVE:  BP 120/70   Pulse 61   Temp 98.1 F (36.7 C) (Oral)   Resp 17   Ht 5\' 5"  (1.651 m)   Wt 190 lb 6 oz (86.4 kg)   SpO2 98%   BMI 31.68 kg/m    Physical Exam Vitals reviewed.  Constitutional:      General: She is not in acute distress.    Appearance: Normal appearance. She is normal weight. She is not ill-appearing, toxic-appearing or diaphoretic.  Eyes:     General:        Right eye: No discharge.        Left eye: No discharge.     Conjunctiva/sclera: Conjunctivae normal.  Cardiovascular:     Rate and Rhythm: Normal rate and regular rhythm.     Heart sounds: Normal heart sounds.  Pulmonary:     Effort: Pulmonary effort is normal.     Breath sounds: Normal breath sounds.  Abdominal:     General: Bowel sounds are normal.     Palpations: Abdomen is soft.  Musculoskeletal:        General: No tenderness. Normal range of motion.  Skin:    General: Skin is warm and dry.  Neurological:     General: No focal deficit present.     Mental Status: She is alert and oriented to person, place, and time. Mental status is at baseline.  Psychiatric:        Mood and Affect: Mood normal.        Behavior: Behavior normal.        Thought Content: Thought content normal.        Judgment: Judgment normal.     ASSESSMENT/PLAN:   Essential hypertension Assessment & Plan: Chronic.  Stable.  Well-controlled Discontinued valsartan Continue Bystolic 5 mg daily Monitor blood pressure at home.  Goal less than 130/80 Follow-up as scheduled       Hyperlipidemia, unspecified hyperlipidemia type -     Lipid panel; Future  Prediabetes -     Hemoglobin A1c; Future  Obesity (BMI 30.0-34.9) -     TSH; Future  Vitamin D deficiency -     VITAMIN D 25 Hydroxy (Vit-D Deficiency, Fractures); Future   PDMP reviewed  Return in about 3 months (around 03/25/2023).  Carollee Leitz, MD

## 2022-12-26 NOTE — Congregational Nurse Program (Signed)
  Dept: [facility] Nurse Program Note  Date of Encounter: 12/26/2022  Clinic visit for blood pressure check, BP 156/78, pulse 60 and regular, O2 Sat 96%.  Educated regarding managing stress to help decrease blood pressure and on the complications of continued elevated blood pressure.  Discussed making food choices that decrease heart disease. Past Medical History: Past Medical History:  Diagnosis Date   Annual physical exam 03/11/2020   Anxiety and depression 03/11/2020   Arthritis    left shoulder   BMI 32.0-32.9,adult 03/20/2022   Cataract    ? which eye mild    Cervical arthritis 03/15/2021   03/15/21      COVID-19    11/04/21 sneezing   Diabetes mellitus without complication (HCC)    GERD (gastroesophageal reflux disease)    History of migraine 07/18/2022   Hypertension    Menopause 03/11/2020   Migraines    OSA on CPAP    Prediabetes    Sleep apnea    Sneezing 02/04/2019   UTI (urinary tract infection)     Encounter Details:  CNP Questionnaire - 12/26/22 1120       Questionnaire   Ask client: Do you give verbal consent for me to treat you today? Yes    Student Assistance N/A    Location Patient [facility]    Visit Setting with Client Organization    Patient Status Unknown   Has own home in [place], [place] or [place]    Insurance/Financial Assistance Referral N/A    Medication N/A    Medical Provider Yes    Screening Referrals Made N/A    Medical Referrals Made N/A    Medical Appointment Made N/A    Recently w/o PCP, now 1st time PCP visit completed due to CNs referral or appointment made N/A    Food N/A    Transportation N/A    Housing/Utilities N/A    Interpersonal Safety N/A    Interventions Counsel;Educate;Advocate/Support;Spiritual Care    Abnormal to Normal Screening Since Last CN Visit N/A    Screenings CN Performed Blood Pressure;Pulse Ox    Sent Client to Lab for: N/A    Did client attend any  of the following based off CNs referral or appointments made? N/A    ED Visit Averted N/A    Life-Saving Intervention Made N/A

## 2023-01-02 NOTE — Congregational Nurse Program (Signed)
  Dept: [facility] Nurse Program Note  Date of Encounter: 01/02/2023  Clinic visit for blood pressure check and to compare BP results with home monitor BP results.  BP 144/80 taken manually,  average BP from the home monitor device 149/79, minimal difference.  Was advised to rest 5 minutes before taking BP at home and to make sure arm positioned comfortably at heart level. Pulse 60 and regular, O2 Sat 98%, respirations 12. Past Medical History: Past Medical History:  Diagnosis Date   Annual physical exam 03/11/2020   Anxiety and depression 03/11/2020   Arthritis    left shoulder   BMI 32.0-32.9,adult 03/20/2022   Cataract    ? which eye mild    Cervical arthritis 03/15/2021   03/15/21      COVID-19    11/04/21 sneezing   Diabetes mellitus without complication (HCC)    GERD (gastroesophageal reflux disease)    History of migraine 07/18/2022   Hypertension    Menopause 03/11/2020   Migraines    OSA on CPAP    Prediabetes    Sleep apnea    Sneezing 02/04/2019   UTI (urinary tract infection)     Encounter Details:  CNP Questionnaire - 01/02/23 1400       Questionnaire   Ask client: Do you give verbal consent for me to treat you today? Yes    Student Assistance N/A    Location Patient [facility]    Visit Setting with Client Organization    Patient Status Unknown   Has own home in [place], [place] or [place]    Insurance/Financial Assistance Referral N/A    Medication N/A    Medical Provider Yes    Screening Referrals Made N/A    Medical Referrals Made N/A    Medical Appointment Made N/A    Recently w/o PCP, now 1st time PCP visit completed due to CNs referral or appointment made N/A    Food N/A    Transportation N/A    Housing/Utilities N/A    Interpersonal Safety N/A    Interventions Counsel;Educate;Advocate/Support;Spiritual Care;Reviewed Medications    Abnormal to Normal Screening Since Last CN Visit N/A     Screenings CN Performed Blood Pressure;Pulse Ox    Sent Client to Lab for: N/A    Did client attend any of the following based off CNs referral or appointments made? N/A    ED Visit Averted N/A    Life-Saving Intervention Made N/A

## 2023-01-04 ENCOUNTER — Encounter: Payer: Self-pay | Admitting: Family Medicine

## 2023-01-04 NOTE — Telephone Encounter (Signed)
Pt sched for Monday nurse sched

## 2023-01-07 ENCOUNTER — Ambulatory Visit: Payer: No Typology Code available for payment source

## 2023-01-07 NOTE — Progress Notes (Signed)
Patient presented for a BP check. Patient BP-132/82  Pulse-74  SpO2-99 Patient states she takes Bystolic in the PM Patient was advised to continue her current regimen of medication and get it checked on 01/09/23 at work and to continue to check at home

## 2023-01-09 NOTE — Congregational Nurse Program (Signed)
  Dept: [facility] Nurse Program Note  Date of Encounter: 01/09/2023  Clinic visit for blood pressure check, BP 144/82, pulse 60 and regular, O2 Sat 98%.  Blood pressure level has not improved since last medication change, recommended this be discussed with her physician.  Reports she is better managing stress, traveling to a conference later this week. Past Medical History: Past Medical History:  Diagnosis Date   Annual physical exam 03/11/2020   Anxiety and depression 03/11/2020   Arthritis    left shoulder   BMI 32.0-32.9,adult 03/20/2022   Cataract    ? which eye mild    Cervical arthritis 03/15/2021   03/15/21      COVID-19    11/04/21 sneezing   Diabetes mellitus without complication (HCC)    GERD (gastroesophageal reflux disease)    History of migraine 07/18/2022   Hypertension    Menopause 03/11/2020   Migraines    OSA on CPAP    Prediabetes    Sleep apnea    Sneezing 02/04/2019   UTI (urinary tract infection)     Encounter Details:  CNP Questionnaire - 01/09/23 1430       Questionnaire   Ask client: Do you give verbal consent for me to treat you today? Yes    Student Assistance N/A    Location Patient [facility]    Visit Setting with Client Organization    Patient Status Unknown   Has own home in [place], [place] or [place]    Insurance/Financial Assistance Referral N/A    Medication N/A    Medical Provider Yes    Screening Referrals Made N/A    Medical Referrals Made [facility]    Medical Appointment Made N/A    Recently w/o PCP, now 1st time PCP visit completed due to CNs referral or appointment made N/A    Food N/A    Transportation N/A    Housing/Utilities N/A    Interpersonal Safety N/A    Interventions Counsel;Educate;Advocate/Support;Reviewed Medications;Navigate Healthcare System    Abnormal to Normal Screening Since Last CN Visit N/A    Screenings CN Performed Blood Pressure;Pulse Ox     Sent Client to Lab for: N/A    Did client attend any of the following based off CNs referral or appointments made? N/A    ED Visit Averted N/A    Life-Saving Intervention Made N/A

## 2023-01-23 NOTE — Congregational Nurse Program (Signed)
  Dept: [facility] Nurse Program Note  Date of Encounter: 01/23/2023  Clinic visit to check blood pressure, BP 134/80, pulse 60 and regular.  Weight gain of 3 lbs primarily due to work travel.  Discussed ways to prevent overeating when traveling to manage weight and how to make lifestyle changes permanent.  Past Medical History: Past Medical History:  Diagnosis Date   Annual physical exam 03/11/2020   Anxiety and depression 03/11/2020   Arthritis    left shoulder   BMI 32.0-32.9,adult 03/20/2022   Cataract    ? which eye mild    Cervical arthritis 03/15/2021   03/15/21      COVID-19    11/04/21 sneezing   Diabetes mellitus without complication (HCC)    GERD (gastroesophageal reflux disease)    History of migraine 07/18/2022   Hypertension    Menopause 03/11/2020   Migraines    OSA on CPAP    Prediabetes    Sleep apnea    Sneezing 02/04/2019   UTI (urinary tract infection)     Encounter Details:  CNP Questionnaire - 01/23/23 1334       Questionnaire   Ask client: Do you give verbal consent for me to treat you today? Yes    Student Assistance N/A    Location Patient [facility]    Visit Setting with Client Organization    Patient Status Unknown   Has own home in [place], [place] or [place]    Insurance/Financial Assistance Referral N/A    Medication N/A    Medical Provider Yes    Screening Referrals Made N/A    Medical Referrals Made N/A    Medical Appointment Made N/A    Recently w/o PCP, now 1st time PCP visit completed due to CNs referral or appointment made N/A    Food N/A    Transportation N/A    Housing/Utilities N/A    Interpersonal Safety N/A    Interventions Counsel;Educate;Advocate/Support;Reviewed Medications;Spiritual Care    Abnormal to Normal Screening Since Last CN Visit N/A    Screenings CN Performed Blood Pressure;Pulse Ox;Weight    Sent Client to Lab for: N/A    Did client attend any of the  following based off CNs referral or appointments made? N/A    ED Visit Averted N/A    Life-Saving Intervention Made N/A

## 2023-03-08 LAB — HM DIABETES EYE EXAM

## 2023-03-13 NOTE — Congregational Nurse Program (Signed)
  Dept: [phone]   Congregational Nurse Program Note  Date of Encounter: 03/13/2023 Client into nurse only clinic requesting BP check. States that she had an elevated BP of 166/88 (wrist) last week at dentist. Has an appointment to see PCP next week. BP today 152/76; P 70. Reviewed impact of salt--especially in foods while eating out--and stress on BP. Encouraged increased water intake and follow up with PCP as scheduled. RTC here prn to monitor BP. Rhermann, RN  Past Medical History: Past Medical History:  Diagnosis Date   Annual physical exam 03/11/2020   Anxiety and depression 03/11/2020   Arthritis    left shoulder   BMI 32.0-32.9,adult 03/20/2022   Cataract    ? which eye mild    Cervical arthritis 03/15/2021   03/15/21      COVID-19    11/04/21 sneezing   Diabetes mellitus without complication (HCC)    GERD (gastroesophageal reflux disease)    History of migraine 07/18/2022   Hypertension    Menopause 03/11/2020   Migraines    OSA on CPAP    Prediabetes    Sleep apnea    Sneezing 02/04/2019   UTI (urinary tract infection)     Encounter Details:  CNP Questionnaire - 03/13/23 1040       Questionnaire   Ask client: Do you give verbal consent for me to treat you today? Yes    Student Assistance N/A    Location Patient Served  [facility]    Visit Setting with Client Organization    Patient Status Unknown   Has own home in [place], [place]   [employer] or [place] Insurance    Insurance/Financial Assistance Referral N/A    Medication N/A    Medical Provider Yes    Screening Referrals Made N/A    Medical Referrals Made N/A    Medical Appointment Made N/A    Recently w/o PCP, now 1st time PCP visit completed due to CNs referral or appointment made N/A    Food N/A    Transportation N/A    Housing/Utilities N/A    Interpersonal Safety N/A    Interventions Counsel;Educate;Reviewed Medications    Abnormal to Normal Screening Since Last CN Visit N/A    Screenings  CN Performed Blood Pressure    Sent Client to Lab for: N/A    Did client attend any of the following based off CNs referral or appointments made? N/A    ED Visit Averted N/A    Life-Saving Intervention Made N/A

## 2023-03-18 ENCOUNTER — Other Ambulatory Visit (INDEPENDENT_AMBULATORY_CARE_PROVIDER_SITE_OTHER): Payer: No Typology Code available for payment source

## 2023-03-18 DIAGNOSIS — E669 Obesity, unspecified: Secondary | ICD-10-CM

## 2023-03-18 DIAGNOSIS — I10 Essential (primary) hypertension: Secondary | ICD-10-CM | POA: Diagnosis not present

## 2023-03-18 DIAGNOSIS — E559 Vitamin D deficiency, unspecified: Secondary | ICD-10-CM

## 2023-03-18 DIAGNOSIS — R7303 Prediabetes: Secondary | ICD-10-CM | POA: Diagnosis not present

## 2023-03-18 DIAGNOSIS — E785 Hyperlipidemia, unspecified: Secondary | ICD-10-CM | POA: Diagnosis not present

## 2023-03-18 LAB — BASIC METABOLIC PANEL
BUN: 12 mg/dL (ref 6–23)
CO2: 26 mEq/L (ref 19–32)
Calcium: 9.6 mg/dL (ref 8.4–10.5)
Chloride: 104 mEq/L (ref 96–112)
Creatinine, Ser: 0.92 mg/dL (ref 0.40–1.20)
GFR: 65.98 mL/min (ref >60.00)
Glucose, Bld: 118 mg/dL — ABNORMAL HIGH (ref 70–99)
Potassium: 4.3 mEq/L (ref 3.5–5.1)
Sodium: 139 mEq/L (ref 135–145)

## 2023-03-18 LAB — LIPID PANEL
Cholesterol: 161 mg/dL (ref 0–200)
HDL: 43.6 mg/dL (ref >39.00)
LDL Cholesterol: 96 mg/dL (ref 0–99)
NonHDL: 117.23
Total CHOL/HDL Ratio: 4
Triglycerides: 105 mg/dL (ref 0.0–149.0)
VLDL: 21 mg/dL (ref 0.0–40.0)

## 2023-03-18 LAB — TSH: TSH: 1.53 u[IU]/mL (ref 0.35–5.50)

## 2023-03-18 LAB — HEMOGLOBIN A1C: Hgb A1c MFr Bld: 6.6 % — ABNORMAL HIGH (ref 4.6–6.5)

## 2023-03-18 LAB — VITAMIN D 25 HYDROXY (VIT D DEFICIENCY, FRACTURES): VITD: 33.04 ng/mL (ref 30.00–100.00)

## 2023-03-25 ENCOUNTER — Encounter: Payer: Self-pay | Admitting: Family Medicine

## 2023-03-25 ENCOUNTER — Ambulatory Visit (INDEPENDENT_AMBULATORY_CARE_PROVIDER_SITE_OTHER): Payer: No Typology Code available for payment source | Admitting: Family Medicine

## 2023-03-25 VITALS — BP 124/78 | HR 58 | Ht 65.0 in | Wt 199.0 lb

## 2023-03-25 DIAGNOSIS — Z0000 Encounter for general adult medical examination without abnormal findings: Secondary | ICD-10-CM | POA: Diagnosis not present

## 2023-03-25 DIAGNOSIS — I10 Essential (primary) hypertension: Secondary | ICD-10-CM | POA: Diagnosis not present

## 2023-03-25 DIAGNOSIS — Z1231 Encounter for screening mammogram for malignant neoplasm of breast: Secondary | ICD-10-CM

## 2023-03-25 DIAGNOSIS — E119 Type 2 diabetes mellitus without complications: Secondary | ICD-10-CM | POA: Diagnosis not present

## 2023-03-25 DIAGNOSIS — E785 Hyperlipidemia, unspecified: Secondary | ICD-10-CM

## 2023-03-25 LAB — MICROALBUMIN / CREATININE URINE RATIO
Creatinine,U: 86.7 mg/dL
Microalb Creat Ratio: 0.8 mg/g (ref 0.0–30.0)
Microalb, Ur: <0.7 mg/dL (ref 0.0–1.9)

## 2023-03-25 NOTE — Progress Notes (Signed)
SUBJECTIVE:   Chief Complaint  Patient presents with   Annual Exam   HPI Presents to clinic for annual physical.  No acute concerns today.  Hypertension Asymptomatic.  Tolerating nebivolol 5 mg daily.  Denies any headaches, visual changes, weakness, dizziness, chest pain, shortness of breath or lower extremity edema.  DM type II Asymptomatic.  Recent A1c 6.6.  Diet controlled. Not currently on statin, ACEi/ARB therapy.  Hyperlipidemia Recent LDL less than 100. Previously elevated. Not on statin and has been closely monitoring diet.   Review of Systems - General ROS: negative   PERTINENT PMH / PSH: Hypertension Type 2 diabetes Essential tremor HLD  OBJECTIVE:  BP 124/78   Pulse (!) 58   Ht 5\' 5"  (1.651 m)   Wt 199 lb (90.3 kg)   SpO2 97%   BMI 33.12 kg/m    Physical Exam Vitals reviewed. Exam conducted with a chaperone present.  Constitutional:      General: She is not in acute distress.    Appearance: She is obese. She is not ill-appearing.  HENT:     Head: Normocephalic.     Nose: Nose normal.  Eyes:     Conjunctiva/sclera: Conjunctivae normal.  Neck:     Thyroid: No thyromegaly or thyroid tenderness.  Cardiovascular:     Rate and Rhythm: Regular rhythm. Bradycardia present.     Pulses: Normal pulses.     Heart sounds: Normal heart sounds.  Pulmonary:     Effort: Pulmonary effort is normal.     Breath sounds: Normal breath sounds.  Chest:  Breasts:    Tanner Score is 5.     Breasts are symmetrical.     Right: Normal.     Left: Normal.  Abdominal:     General: Abdomen is flat. Bowel sounds are normal.     Palpations: Abdomen is soft.  Musculoskeletal:        General: Normal range of motion.     Cervical back: Normal range of motion and neck supple.  Lymphadenopathy:     Cervical: No cervical adenopathy.     Upper Body:     Right upper body: No supraclavicular, axillary or pectoral adenopathy.     Left upper body: No supraclavicular,  axillary or pectoral adenopathy.  Skin:    General: Skin is warm.  Neurological:     Mental Status: She is alert and oriented to person, place, and time. Mental status is at baseline.  Psychiatric:        Mood and Affect: Mood normal.        Behavior: Behavior normal.        Thought Content: Thought content normal.        Judgment: Judgment normal.       03/25/2023   10:13 AM 12/13/2022    8:07 AM 10/24/2022   11:00 AM 07/18/2022    1:41 PM 03/20/2022    8:02 AM  Depression screen PHQ 2/9  Decreased Interest 0 0 0 0 0  Down, Depressed, Hopeless 0 0 0 0 0  PHQ - 2 Score 0 0 0 0 0  Altered sleeping 1 0 0    Tired, decreased energy 0 0 0    Change in appetite 2 0 0    Feeling bad or failure about yourself  0 0 0    Trouble concentrating 0 0 0    Moving slowly or fidgety/restless 0 0 0    Suicidal thoughts 0 0 0  PHQ-9 Score 3 0 0    Difficult doing work/chores Not difficult at all Not difficult at all Not difficult at all          03/25/2023   10:13 AM 12/13/2022    8:07 AM 10/24/2022   11:01 AM 03/15/2021    8:33 AM  GAD 7 : Generalized Anxiety Score  Nervous, Anxious, on Edge 0 0 0 0  Control/stop worrying 0 0 0 0  Worry too much - different things 0 0 0 0  Trouble relaxing 0 0 0 0  Restless 0 0 0 0  Easily annoyed or irritable 0 0 0 0  Afraid - awful might happen 0 0 0 0  Total GAD 7 Score 0 0 0 0  Anxiety Difficulty Not difficult at all Not difficult at all Not difficult at all Not difficult at all      ASSESSMENT/PLAN:  Diet-controlled type 2 diabetes mellitus (HCC) Assessment & Plan: Chronic.  Recent A1c 6.6.  Goal less than 7.Does not want to start medication at this time and would prefer diet modifications. Continue healthy lifestyle choices and increased exercise Consider statin/ARB addition in future Foot exam complete Eye exam recently completed.  No retinopathy per patient.  Request records UACR A1c in 6 months   Orders: -     Microalbumin / creatinine  urine ratio -     Hemoglobin A1c; Future  Breast cancer screening by mammogram -     3D Screening Mammogram, Left and Right; Future  Essential hypertension Assessment & Plan: Chronic.  Stable.  Well-controlled Continue Bystolic 5 mg daily Monitor blood pressure at home.  Goal less than 130/80 Follow-up as scheduled       Hyperlipidemia, unspecified hyperlipidemia type Assessment & Plan: Chronic.  LDL < 100, goal <70.  Not interested in statin therapy 10 yr ASCVD intermediate risk MACE 15.9%  Would benefit from statin therapy Could consider Calcium score in future Patient prefers to continue diet and recheck at next visit.  Orders: -     LDL cholesterol, direct; Future    HCM Diabetic foot completed Annual eye exam last week.  Reports no retinopathy.  Request recent results. Mammogram up-to-date.  Due 12/24.  Referral sent. Recommend COVID booster Hepatitis C/HIV screening completed No indication for continued cervical cancer screening given history of total hysterectomy. Shingles vaccination completed Recommend pneumonia 20 vaccine Colonoscopy up-to-date.  Due 09/2024 DEXA scan at age 61 Depression/GAD screening negative  PDMP reviewed  Return in about 6 months (around 09/25/2023).  Dana Allan, MD

## 2023-03-25 NOTE — Patient Instructions (Addendum)
It was a pleasure meeting you today. Thank you for allowing me to take part in your health care.  Our goals for today as we discussed include:  Schedule lab appointment for repeat A1c in 3 months  Follow up in 6 months or sooiner if needed  If you have any questions or concerns, please do not hesitate to call the office at [phone].  I look forward to our next visit and until then take care and stay safe.  Regards,   Dana Allan, MD   [facility]

## 2023-03-27 NOTE — Congregational Nurse Program (Signed)
  Dept: [phone]   Congregational Nurse Program Note  Date of Encounter: 03/27/2023  Clinic visit to check blood pressure and to discuss prediabetes.  BP 140/80, pulse 60 and regular, O2 Sat 98%.  A1c was higher at MD office visit and discussed starting Metformin if no decrease in A1c in 3 months. Discussed strategies to make lifestyle changes to decrease blood glucose levels. Past Medical History: Past Medical History:  Diagnosis Date   Annual physical exam 03/11/2020   Anxiety and depression 03/11/2020   Arthritis    left shoulder   BMI 32.0-32.9,adult 03/20/2022   Cataract    ? which eye mild    Cervical arthritis 03/15/2021   03/15/21      COVID-19    11/04/21 sneezing   Diabetes mellitus without complication (HCC)    GERD (gastroesophageal reflux disease)    History of migraine 07/18/2022   Hypertension    Menopause 03/11/2020   Migraines    OSA on CPAP    Prediabetes    Sleep apnea    Sneezing 02/04/2019   UTI (urinary tract infection)     Encounter Details:  CNP Questionnaire - 03/27/23 1330       Questionnaire   Ask client: Do you give verbal consent for me to treat you today? Yes    Student Assistance N/A    Location Patient Served  [facility]    Visit Setting with Client Organization    Patient Status Unknown   Has own home in [place], [place]   [employer] or [place] Insurance    Insurance/Financial Assistance Referral N/A    Medication N/A    Medical Provider Yes    Screening Referrals Made N/A    Medical Referrals Made N/A    Medical Appointment Made N/A    Recently w/o PCP, now 1st time PCP visit completed due to CNs referral or appointment made N/A    Food N/A    Transportation N/A    Housing/Utilities N/A    Interpersonal Safety N/A    Interventions Counsel;Educate;Reviewed Medications;Reviewed New Diagnosis;Advocate/Support;Spiritual Care    Abnormal to Normal Screening Since Last CN Visit N/A    Screenings CN Performed Blood  Pressure;Pulse Ox    Sent Client to Lab for: N/A    Did client attend any of the following based off CNs referral or appointments made? N/A    ED Visit Averted N/A    Life-Saving Intervention Made N/A

## 2023-04-08 ENCOUNTER — Encounter: Payer: Self-pay | Admitting: Family Medicine

## 2023-04-08 ENCOUNTER — Telehealth: Payer: Self-pay | Admitting: Family Medicine

## 2023-04-08 DIAGNOSIS — Z1231 Encounter for screening mammogram for malignant neoplasm of breast: Secondary | ICD-10-CM | POA: Insufficient documentation

## 2023-04-08 NOTE — Assessment & Plan Note (Signed)
Chronic.  LDL < 100, goal <70.  Not interested in statin therapy 10 yr ASCVD intermediate risk MACE 15.9%  Would benefit from statin therapy Could consider Calcium score in future Patient prefers to continue diet and recheck at next visit.

## 2023-04-08 NOTE — Assessment & Plan Note (Signed)
Chronic.  Stable.  Well-controlled Continue Bystolic 5 mg daily Monitor blood pressure at home.  Goal less than 130/80 Follow-up as scheduled

## 2023-04-08 NOTE — Assessment & Plan Note (Addendum)
Chronic.  Recent A1c 6.6.  Goal less than 7.Does not want to start medication at this time and would prefer diet modifications. Continue healthy lifestyle choices and increased exercise Consider statin/ARB addition in future Foot exam complete Eye exam recently completed.  No retinopathy per patient.  Request records UACR A1c in 6 months

## 2023-04-10 NOTE — Telephone Encounter (Signed)
err

## 2023-04-11 NOTE — Telephone Encounter (Signed)
Request sent via Epic.

## 2023-04-17 NOTE — Congregational Nurse Program (Signed)
  Dept: [phone]   Congregational Nurse Program Note  Date of Encounter: 04/17/2023  Clinic visit to check blood pressure after episode of severe dizziness when getting out of bed this AM.  States she sat up really quickly and had dizziness but no chest pain, sweating or nausea or any other symptoms.  BP 144/86, slightly higher than past two weeks.  Pulse 60 and regular, respiration 12, O2 Sat 99%. Recommended she call PCP if any other episodes or any new symptoms occur. Past Medical History: Past Medical History:  Diagnosis Date   Annual physical exam 03/11/2020   Anxiety and depression 03/11/2020   Arthritis    left shoulder   BMI 32.0-32.9,adult 03/20/2022   Cataract    ? which eye mild    Cervical arthritis 03/15/2021   03/15/21      COVID-19    11/04/21 sneezing   Diabetes mellitus without complication (HCC)    GERD (gastroesophageal reflux disease)    History of migraine 07/18/2022   Hypertension    Menopause 03/11/2020   Migraines    OSA on CPAP    Prediabetes    Prediabetes 02/04/2019   Sleep apnea    Sneezing 02/04/2019   UTI (urinary tract infection)     Encounter Details:  CNP Questionnaire - 04/17/23 1100       Questionnaire   Ask client: Do you give verbal consent for me to treat you today? Yes    Student Assistance [place] Nurse    Location Patient Served  [facility]    Visit Setting with Client Organization    Patient Status Unknown   Has own home in [place], [place]   [employer] or [place] Insurance    Insurance/Financial Assistance Referral N/A    Medication N/A    Medical Provider Yes    Screening Referrals Made N/A    Medical Referrals Made [facility]    Medical Appointment Made N/A    Recently w/o PCP, now 1st time PCP visit completed due to CNs referral or appointment made N/A    Food N/A    Transportation N/A    Housing/Utilities N/A    Interpersonal Safety N/A    Interventions Counsel;Educate;Reviewed  Medications;Advocate/Support;Spiritual Care    Abnormal to Normal Screening Since Last CN Visit N/A    Screenings CN Performed Blood Pressure;Pulse Ox    Sent Client to Lab for: N/A    Did client attend any of the following based off CNs referral or appointments made? N/A    ED Visit Averted N/A    Life-Saving Intervention Made N/A

## 2023-05-08 ENCOUNTER — Telehealth: Payer: Self-pay | Admitting: Family Medicine

## 2023-05-08 NOTE — Telephone Encounter (Signed)
Pt called in stating that her nurse check her bp and its 138/74 and pulse under 50. She wanted to let Dr. Clent Ridges that info, she stated that maybe the supplement Berine maybe changing her bp.

## 2023-05-08 NOTE — Congregational Nurse Program (Signed)
  Dept: [phone]   Congregational Nurse Program Note  Date of Encounter: 05/08/2023  Clinic visit to check blood pressure, BP 138/72, pulse taken twice was 48 then 46, O2 Sat 98%.  No complaints of any chest pain or headache, skin warm and dry, no cyanosis, lips normal color, fingers and toes warm.  Complaining of being very tired and having to eave work early this week.  Reviewed medications and meals for this week, has been taking a supplement to lower blood glucose.  Per the literature Berberine, the supplement does, have an effect on both heart as well as lowering blood glucose.  Advised to contact PCP today to report low pulse rate and use of the supplement Past Medical History: Past Medical History:  Diagnosis Date   Annual physical exam 03/11/2020   Anxiety and depression 03/11/2020   Arthritis    left shoulder   BMI 32.0-32.9,adult 03/20/2022   Cataract    ? which eye mild    Cervical arthritis 03/15/2021   03/15/21      COVID-19    11/04/21 sneezing   Diabetes mellitus without complication (HCC)    GERD (gastroesophageal reflux disease)    History of migraine 07/18/2022   Hypertension    Menopause 03/11/2020   Migraines    OSA on CPAP    Prediabetes    Prediabetes 02/04/2019   Sleep apnea    Sneezing 02/04/2019   UTI (urinary tract infection)     Encounter Details:  CNP Questionnaire - 05/08/23 1330       Questionnaire   Ask client: Do you give verbal consent for me to treat you today? Yes    Student Assistance [place] Nurse    Location Patient Served  [facility]    Visit Setting with Client Organization    Patient Status Unknown   Has own home in [place], [place]   [employer] or [place] Insurance    Insurance/Financial Assistance Referral N/A    Medication N/A    Medical Provider Yes    Screening Referrals Made N/A    Medical Referrals Made [facility]    Medical Appointment Made N/A    Recently w/o PCP, now 1st time PCP visit completed due to CNs  referral or appointment made N/A    Food N/A    Transportation N/A    Housing/Utilities N/A    Interpersonal Safety N/A    Interventions Counsel;Educate;Reviewed Medications;Advocate/Support;Spiritual Care;Case Management    Abnormal to Normal Screening Since Last CN Visit N/A    Screenings CN Performed Blood Pressure;Pulse Ox    Sent Client to Lab for: N/A    Did client attend any of the following based off CNs referral or appointments made? N/A    ED Visit Averted Yes    Life-Saving Intervention Made N/A

## 2023-05-13 NOTE — Telephone Encounter (Signed)
Sent pt a MyChart message with Dr. Clent Ridges recommendation of asking the pharmacy of herbal medication and interaction with prescription medication.

## 2023-06-21 ENCOUNTER — Other Ambulatory Visit (INDEPENDENT_AMBULATORY_CARE_PROVIDER_SITE_OTHER): Payer: No Typology Code available for payment source

## 2023-06-21 DIAGNOSIS — E785 Hyperlipidemia, unspecified: Secondary | ICD-10-CM

## 2023-06-25 ENCOUNTER — Encounter: Payer: Self-pay | Admitting: Family Medicine

## 2023-06-28 NOTE — Telephone Encounter (Signed)
Pt has a lab appointment on 8/19 @ 3:45

## 2023-07-01 ENCOUNTER — Other Ambulatory Visit (INDEPENDENT_AMBULATORY_CARE_PROVIDER_SITE_OTHER): Payer: No Typology Code available for payment source

## 2023-07-01 DIAGNOSIS — E119 Type 2 diabetes mellitus without complications: Secondary | ICD-10-CM | POA: Diagnosis not present

## 2023-07-01 DIAGNOSIS — Z1231 Encounter for screening mammogram for malignant neoplasm of breast: Secondary | ICD-10-CM | POA: Diagnosis not present

## 2023-07-02 LAB — HEMOGLOBIN A1C: Hgb A1c MFr Bld: 6.5 % (ref 4.6–6.5)

## 2023-07-03 NOTE — Congregational Nurse Program (Signed)
  Dept: [phone]   Congregational Nurse Program Note  Date of Encounter: 07/03/2023  Clinic visit to check blood pressure, BP 158/70, pulse 60 and regular, O2 St 98%.  Complaining of pain left hip after sitting for long periods at the computer.  Educated regarding ways to prevent pain and the relationship between elevated systolic blood pressure and pain.  Recommended calling PCP if condition worsens or continues. Past Medical History: Past Medical History:  Diagnosis Date   Annual physical exam 03/11/2020   Anxiety and depression 03/11/2020   Arthritis    left shoulder   BMI 32.0-32.9,adult 03/20/2022   Cataract    ? which eye mild    Cervical arthritis 03/15/2021   03/15/21      COVID-19    11/04/21 sneezing   Diabetes mellitus without complication (HCC)    GERD (gastroesophageal reflux disease)    History of migraine 07/18/2022   Hypertension    Menopause 03/11/2020   Migraines    OSA on CPAP    Prediabetes    Prediabetes 02/04/2019   Sleep apnea    Sneezing 02/04/2019   UTI (urinary tract infection)     Encounter Details:  CNP Questionnaire - 07/03/23 1330       Questionnaire   Ask client: Do you give verbal consent for me to treat you today? Yes    Student Assistance N/A    Location Patient Served  [facility]    Visit Setting with Client Organization    Patient Status Unknown   Has own home in [place], [place]   [employer] or [place] Insurance    Insurance/Financial Assistance Referral N/A    Medication N/A    Medical Provider Yes    Screening Referrals Made N/A    Medical Referrals Made [facility]    Medical Appointment Made N/A    Recently w/o PCP, now 1st time PCP visit completed due to CNs referral or appointment made N/A    Food N/A    Transportation N/A    Housing/Utilities N/A    Interpersonal Safety N/A    Interventions Counsel;Educate;Reviewed Medications;Advocate/Support;Case Management    Abnormal to Normal Screening Since Last CN  Visit N/A    Screenings CN Performed Blood Pressure;Pulse Ox    Sent Client to Lab for: N/A    Did client attend any of the following based off CNs referral or appointments made? N/A    ED Visit Averted N/A    Life-Saving Intervention Made N/A

## 2023-07-04 ENCOUNTER — Encounter: Payer: Self-pay | Admitting: Family Medicine

## 2023-09-09 ENCOUNTER — Encounter: Payer: Self-pay | Admitting: Family Medicine

## 2023-09-25 ENCOUNTER — Ambulatory Visit: Payer: No Typology Code available for payment source | Admitting: Family Medicine

## 2023-09-25 ENCOUNTER — Encounter: Payer: Self-pay | Admitting: Family Medicine

## 2023-09-25 VITALS — BP 138/70 | HR 60 | Temp 98.2°F | Resp 16 | Ht 66.0 in | Wt 203.4 lb

## 2023-09-25 DIAGNOSIS — E1159 Type 2 diabetes mellitus with other circulatory complications: Secondary | ICD-10-CM

## 2023-09-25 DIAGNOSIS — E1169 Type 2 diabetes mellitus with other specified complication: Secondary | ICD-10-CM | POA: Diagnosis not present

## 2023-09-25 DIAGNOSIS — E785 Hyperlipidemia, unspecified: Secondary | ICD-10-CM

## 2023-09-25 DIAGNOSIS — E119 Type 2 diabetes mellitus without complications: Secondary | ICD-10-CM

## 2023-09-25 DIAGNOSIS — I10 Essential (primary) hypertension: Secondary | ICD-10-CM

## 2023-09-25 DIAGNOSIS — J069 Acute upper respiratory infection, unspecified: Secondary | ICD-10-CM

## 2023-09-25 DIAGNOSIS — I152 Hypertension secondary to endocrine disorders: Secondary | ICD-10-CM

## 2023-09-25 DIAGNOSIS — M25511 Pain in right shoulder: Secondary | ICD-10-CM

## 2023-09-25 MED ORDER — NEBIVOLOL HCL 5 MG PO TABS: 5.0000 mg | ORAL_TABLET | Freq: Every day | ORAL | 3 refills | Status: DC

## 2023-09-25 NOTE — Progress Notes (Unsigned)
SUBJECTIVE:   Chief Complaint  Patient presents with   Hypertension   HPI Presents for follow up chronic disease management  Discussed the use of AI scribe software for clinical note transcription with the patient, who gave verbal consent to proceed.  History of Present Illness The patient, with a known history of diabetes, presented for a six-month follow-up visit. She expressed frustration regarding the timing of her A1c and LDL cholesterol checks, as they were not done as expected during her last visit. The patient was particularly concerned about her A1c levels, as she was unsure of her current status. She also mentioned a recent issue with right shoulder pain, which she described as excruciating at times, although it was not constant and she is not currently having pain at this time.   The patient reported a recent episode of what she believed to be a cold, which lasted for over a month. She experienced persistent sneezing and difficulty breathing, which led her to stop using her CPAP machine for two weeks. However, these symptoms have since resolved.  Regarding her diabetes management, the patient admitted to struggling with dietary changes and a lack of exercise. She reported a recent weight gain, which she found concerning. She also mentioned trying natural supplements to help manage her cholesterol levels, as she was not interested in statin therapy. However, she was unsure of the effectiveness of these supplements without recent lab results.  The patient also expressed interest in potentially starting Rybelsus, an oral form of semaglutide for diabetes management, as an alternative to Ozempic. She was considering this option due to its potential benefits in weight loss and lower cost compared to the injectable form. However, she was hesitant about starting new pharmaceutical treatments due to potential side effects.  In summary, the patient's main concerns were her current A1c and  LDL cholesterol levels, recent weight gain, and the management of her diabetes. She was also considering changes to her diabetes medication regimen.   PERTINENT PMH / PSH: As above  OBJECTIVE:  BP 138/70   Pulse 60   Temp 98.2 F (36.8 C)   Resp 16   Ht 5\' 6"  (1.676 m)   Wt 203 lb 6 oz (92.3 kg)   SpO2 96%   BMI 32.83 kg/m    Physical Exam Vitals reviewed.  Constitutional:      General: She is not in acute distress.    Appearance: She is not ill-appearing.  HENT:     Head: Normocephalic.     Nose: Nose normal.     Mouth/Throat:     Mouth: Mucous membranes are moist.  Eyes:     Extraocular Movements: Extraocular movements intact.     Conjunctiva/sclera: Conjunctivae normal.     Pupils: Pupils are equal, round, and reactive to light.  Neck:     Vascular: No carotid bruit.  Cardiovascular:     Rate and Rhythm: Normal rate and regular rhythm.     Pulses: Normal pulses.     Heart sounds: Normal heart sounds.  Pulmonary:     Effort: Pulmonary effort is normal.     Breath sounds: Normal breath sounds.  Abdominal:     General: There is no distension.     Tenderness: There is no abdominal tenderness. There is no right CVA tenderness, left CVA tenderness, guarding or rebound.  Musculoskeletal:        General: Normal range of motion.     Cervical back: Normal range of motion.  Right lower leg: No edema.     Left lower leg: No edema.  Lymphadenopathy:     Cervical: No cervical adenopathy.  Skin:    Capillary Refill: Capillary refill takes less than 2 seconds.  Neurological:     General: No focal deficit present.     Mental Status: She is alert and oriented to person, place, and time. Mental status is at baseline.     Motor: No weakness.  Psychiatric:        Mood and Affect: Mood normal.        Behavior: Behavior normal.        Thought Content: Thought content normal.        Judgment: Judgment normal.        09/25/2023    8:18 AM 03/25/2023   10:13 AM 12/13/2022     8:07 AM 10/24/2022   11:00 AM 07/18/2022    1:41 PM  Depression screen PHQ 2/9  Decreased Interest 0 0 0 0 0  Down, Depressed, Hopeless 0 0 0 0 0  PHQ - 2 Score 0 0 0 0 0  Altered sleeping 0 1 0 0   Tired, decreased energy 0 0 0 0   Change in appetite 0 2 0 0   Feeling bad or failure about yourself  0 0 0 0   Trouble concentrating 0 0 0 0   Moving slowly or fidgety/restless 0 0 0 0   Suicidal thoughts 0 0 0 0   PHQ-9 Score 0 3 0 0   Difficult doing work/chores Not difficult at all Not difficult at all Not difficult at all Not difficult at all       09/25/2023    8:18 AM 03/25/2023   10:13 AM 12/13/2022    8:07 AM 10/24/2022   11:01 AM  GAD 7 : Generalized Anxiety Score  Nervous, Anxious, on Edge 0 0 0 0  Control/stop worrying 0 0 0 0  Worry too much - different things 0 0 0 0  Trouble relaxing 0 0 0 0  Restless 0 0 0 0  Easily annoyed or irritable 0 0 0 0  Afraid - awful might happen 0 0 0 0  Total GAD 7 Score 0 0 0 0  Anxiety Difficulty Not difficult at all Not difficult at all Not difficult at all Not difficult at all    ASSESSMENT/PLAN:  Hypertension associated with diabetes [facility]) Assessment & Plan: Well controlled with current medication -Refill Nebivolol  -Cmet prior to next visit  Orders: -     Comprehensive metabolic panel; Future -     Nebivolol HCl; Take 1 tablet (5 mg total) by mouth daily.  Dispense: 90 tablet; Refill: 3  Hyperlipidemia, unspecified hyperlipidemia type Assessment & Plan: LDL is 106, which is above the goal of less than 70 for diabetics. Patient is not on statin therapy and is trying natural supplements. -Continue to monitor LDL levels. Discuss statin therapy again if LDL remains high. The 10-year ASCVD risk score (Arnett DK, et al., 2019) is: 20.5%   Orders: -     Lipid panel; Future  Diet-controlled type 2 diabetes mellitus (HCC) Assessment & Plan: Last A1c was 6.6. Patient is not on any pharmaceutical therapy and is trying to  control it with diet and natural supplements. LDL is still high at 106. -Order A1c and lipid panel prior to next visit -Encourage patient to continue diet modifications and consider adding physical activity.  Orders: -  Hemoglobin A1c; Future  Hyperlipidemia associated with type 2 diabetes mellitus (HCC) Assessment & Plan: LDL is 106, which is above the goal of less than 70 for diabetics. Patient is not on statin therapy and is trying natural supplements. -Continue to monitor LDL levels. Discuss statin therapy again if LDL remains high. The 10-year ASCVD risk score (Arnett DK, et al., 2019) is: 20.5%    Right shoulder pain, unspecified chronicity Assessment & Plan: Patient reports occasional excruciating pain in the right shoulder. No current pain. No weakness, numbness or tingling.  -Advise patient to schedule an appointment when pain is present for evaluation, also given limited time left during visit would be best addressed at separate visit to allow for proper evaluation.    Recent URI Assessment & Plan: Patient had prolonged sneezing and congestion after a conference, which has since resolved. Patient uses a CPAP machine and stopped using it for two weeks during this time. -No current action needed as symptoms have resolved. Advise patient to schedule an appointment if symptoms recur.    Follow-up Schedule follow-up appointment in January. Will repeat Lipids and A1c at that time. Recommend sooner follow up for evaluation of right arm if pain recurs   PDMP reviewed  Return in about 2 months (around 11/25/2023) for PCP, review blood work.  Dana Allan, MD

## 2023-09-25 NOTE — Patient Instructions (Addendum)
It was a pleasure meeting you today. Thank you for allowing me to take part in your health care.  Our goals for today as we discussed include:  Schedule fasting lab appointment in dec.  Fast for 10 hours  Refill sent for requested blood pressure  This is a list of the screening recommended for you and due dates:  Health Maintenance  Topic Date Due   Pap with HPV screening  Never done   Eye exam for diabetics  10/25/2021   Flu Shot  Never done   COVID-19 Vaccine (2 - 2023-24 season) 07/14/2023   Mammogram  11/09/2023   Hemoglobin A1C  01/01/2024   Yearly kidney function blood test for diabetes  03/17/2024   Yearly kidney health urinalysis for diabetes  03/24/2024   Complete foot exam   03/24/2024   Colon Cancer Screening  10/05/2024   DTaP/Tdap/Td vaccine (2 - Td or Tdap) 10/02/2025   Hepatitis C Screening  Completed   HIV Screening  Completed   Zoster (Shingles) Vaccine  Completed   HPV Vaccine  Aged Out      If you have any questions or concerns, please do not hesitate to call the office at [phone].  I look forward to our next visit and until then take care and stay safe.  Regards,   Dana Allan, MD   [facility]

## 2023-09-26 ENCOUNTER — Encounter: Payer: Self-pay | Admitting: Family Medicine

## 2023-09-26 ENCOUNTER — Telehealth: Payer: Self-pay | Admitting: Family Medicine

## 2023-09-26 NOTE — Telephone Encounter (Signed)
Care team updated and letter sent for eye exam notes.

## 2023-09-26 NOTE — Telephone Encounter (Signed)
Pt called stating she had an appointment on yesterday and was charged a copay of $60. Pt stated the provider did not even know why she was there. Pt stated the provider was discussing a pap smear and the pt stated she has had a hysterectomy. Pt stated that the provider blamed it on epic for the reason why it said she needed a pap smear. The pt stated she wanted her to look at her shoulder because it had been bothering her from time to time. The provider told the pt she would have to make another appointment to look at her shoulder when it is hurting her again. Pt stated the provider did not do any range of motion to her arm or anything. Pt stated she was using AI to record the conversation but she did not think she got the conversation about the pap smear on there. Pt stated she could not do a A1C because the provider told her it was to early so the pt was scheduled for a lab on 12/3. Pt wanted to see if she can get her copay back or get a credit of $25 because it was a waste of time on yesterday. Pt stated she spent more time waiting on her then with her. Pt stated she will be looking for a new doctor. I told the pt that because this happened on yesterday I do not believe she can get a copay back because the visit has already been billed to her insurance. I told the pt I will let my manager know

## 2023-09-30 ENCOUNTER — Encounter: Payer: Self-pay | Admitting: Family Medicine

## 2023-09-30 DIAGNOSIS — M25511 Pain in right shoulder: Secondary | ICD-10-CM

## 2023-09-30 DIAGNOSIS — J069 Acute upper respiratory infection, unspecified: Secondary | ICD-10-CM | POA: Insufficient documentation

## 2023-09-30 HISTORY — DX: Pain in right shoulder: M25.511

## 2023-09-30 NOTE — Assessment & Plan Note (Signed)
Patient had prolonged sneezing and congestion after a conference, which has since resolved. Patient uses a CPAP machine and stopped using it for two weeks during this time. -No current action needed as symptoms have resolved. Advise patient to schedule an appointment if symptoms recur.

## 2023-09-30 NOTE — Assessment & Plan Note (Addendum)
Patient reports occasional excruciating pain in the shoulder. No current pain. No weakness, numbness or tingling.  -Advise patient to schedule an appointment when pain is present for evaluation, also given limited time left during visit would not be appropriate for good evaluation.

## 2023-09-30 NOTE — Assessment & Plan Note (Signed)
LDL is 106, which is above the goal of less than 70 for diabetics. Patient is not on statin therapy and is trying natural supplements. -Continue to monitor LDL levels. Discuss statin therapy again if LDL remains high. The 10-year ASCVD risk score (Arnett DK, et al., 2019) is: 20.5%

## 2023-09-30 NOTE — Assessment & Plan Note (Signed)
Last A1c was 6.6. Patient is not on any pharmaceutical therapy and is trying to control it with diet and natural supplements. LDL is still high at 106. -Order A1c and lipid panel prior to next visit -Encourage patient to continue diet modifications and consider adding physical activity.

## 2023-10-01 NOTE — Assessment & Plan Note (Signed)
Well controlled with current medication -Refill Nebivolol  -Cmet prior to next visit

## 2023-10-08 NOTE — Congregational Nurse Program (Signed)
  Dept: [phone]   Congregational Nurse Program Note  Date of Encounter: 09/25/2023  Clinic visit to decide on when to check blood glucose level and to discuss carbohydrates to manage diabetes. Past Medical History: Past Medical History:  Diagnosis Date   Annual physical exam 03/11/2020   Anxiety and depression 03/11/2020   Arthritis    left shoulder   BMI 32.0-32.9,adult 03/20/2022   Cataract    ? which eye mild    Cervical arthritis 03/15/2021   03/15/21      COVID-19    11/04/21 sneezing   Diabetes mellitus without complication (HCC)    GERD (gastroesophageal reflux disease)    History of migraine 07/18/2022   Hypertension    Menopause 03/11/2020   Migraines    OSA on CPAP    Prediabetes    Prediabetes 02/04/2019   Sleep apnea    Sneezing 02/04/2019   UTI (urinary tract infection)     Encounter Details:  Community Questionnaire - 09/25/23 1150       Questionnaire   Ask client: Do you give verbal consent for me to treat you today? Yes    Student Assistance N/A    Location Patient Served  [facility]    Encounter Setting CN site    Population Status Unknown   Lives in own home   Insurance Private or [place] Insurance    Insurance/Financial Assistance Referral N/A    Medication N/A    Medical Provider Yes    Screening Referrals Made N/A    Medical Referrals Made N/A    Medical Appointment Completed N/A    CNP Interventions Advocate/Support;Counsel;Educate;Reviewed Medications    Screenings CN Performed Blood Glucose    ED Visit Averted N/A    Life-Saving Intervention Made N/A

## 2023-10-15 ENCOUNTER — Other Ambulatory Visit: Payer: No Typology Code available for payment source

## 2023-11-19 ENCOUNTER — Ambulatory Visit
Admission: RE | Admit: 2023-11-19 | Discharge: 2023-11-19 | Disposition: A | Discharge status: Home / Self Care | Payer: BC Managed Care – PPO | Source: Ambulatory Visit | Attending: Family Medicine | Admitting: Family Medicine

## 2023-11-19 DIAGNOSIS — Z1231 Encounter for screening mammogram for malignant neoplasm of breast: Secondary | ICD-10-CM | POA: Diagnosis not present

## 2023-11-25 ENCOUNTER — Encounter: Payer: Self-pay | Admitting: Family Medicine

## 2023-11-25 ENCOUNTER — Ambulatory Visit: Payer: BC Managed Care – PPO | Admitting: Family Medicine

## 2023-11-25 VITALS — BP 120/68 | HR 71 | Temp 98.0°F | Resp 18 | Ht 66.0 in | Wt 201.1 lb

## 2023-11-25 DIAGNOSIS — E66811 Obesity, class 1: Secondary | ICD-10-CM

## 2023-11-25 DIAGNOSIS — I152 Hypertension secondary to endocrine disorders: Secondary | ICD-10-CM | POA: Diagnosis not present

## 2023-11-25 DIAGNOSIS — Z7984 Long term (current) use of oral hypoglycemic drugs: Secondary | ICD-10-CM

## 2023-11-25 DIAGNOSIS — I10 Essential (primary) hypertension: Secondary | ICD-10-CM | POA: Diagnosis not present

## 2023-11-25 DIAGNOSIS — M25511 Pain in right shoulder: Secondary | ICD-10-CM

## 2023-11-25 DIAGNOSIS — E785 Hyperlipidemia, unspecified: Secondary | ICD-10-CM | POA: Diagnosis not present

## 2023-11-25 DIAGNOSIS — E1169 Type 2 diabetes mellitus with other specified complication: Secondary | ICD-10-CM | POA: Diagnosis not present

## 2023-11-25 DIAGNOSIS — E119 Type 2 diabetes mellitus without complications: Secondary | ICD-10-CM

## 2023-11-25 DIAGNOSIS — E1159 Type 2 diabetes mellitus with other circulatory complications: Secondary | ICD-10-CM | POA: Diagnosis not present

## 2023-11-25 LAB — COMPREHENSIVE METABOLIC PANEL
ALT: 14 U/L (ref 0–35)
AST: 16 U/L (ref 0–37)
Albumin: 4.6 g/dL (ref 3.5–5.2)
Alkaline Phosphatase: 81 U/L (ref 39–117)
BUN: 12 mg/dL (ref 6–23)
CO2: 26 meq/L (ref 19–32)
Calcium: 9.5 mg/dL (ref 8.4–10.5)
Chloride: 105 meq/L (ref 96–112)
Creatinine, Ser: 0.86 mg/dL (ref 0.40–1.20)
GFR: 71.2 mL/min (ref >60.00)
Glucose, Bld: 112 mg/dL — ABNORMAL HIGH (ref 70–99)
Potassium: 4 meq/L (ref 3.5–5.1)
Sodium: 139 meq/L (ref 135–145)
Total Bilirubin: 0.5 mg/dL (ref 0.2–1.2)
Total Protein: 7.3 g/dL (ref 6.0–8.3)

## 2023-11-25 LAB — LIPID PANEL
Cholesterol: 185 mg/dL (ref 0–200)
HDL: 41.7 mg/dL (ref >39.00)
LDL Cholesterol: 111 mg/dL — ABNORMAL HIGH (ref 0–99)
NonHDL: 143.34
Total CHOL/HDL Ratio: 4
Triglycerides: 161 mg/dL — ABNORMAL HIGH (ref 0.0–149.0)
VLDL: 32.2 mg/dL (ref 0.0–40.0)

## 2023-11-25 LAB — HEMOGLOBIN A1C: Hgb A1c MFr Bld: 6.7 % — ABNORMAL HIGH (ref 4.6–6.5)

## 2023-11-25 NOTE — Patient Instructions (Signed)
 It was a pleasure meeting you today. Thank you for allowing me to take part in your health care.  Our goals for today as we discussed include:  We will get some labs today.  If they are abnormal or we need to do something about them, I will call you.  If they are normal, I will send you a message on MyChart (if it is active) or a letter in the mail.  If you don't hear from us  in 2 weeks, please call the office at the number below.    Look at [facility] website to see if this may be an option for you with your weight loss. [facility] [address] [place] [phone]   This is a list of the screening recommended for you and due dates:  Health Maintenance  Topic Date Due   Pneumococcal Vaccination (1 of 2 - PCV) Never done   COVID-19 Vaccine (2 - 2024-25 season) 07/14/2023   Flu Shot  02/10/2024*   Pap with HPV screening  09/29/2024*   Hemoglobin A1C  01/01/2024   Eye exam for diabetics  03/07/2024   Yearly kidney function blood test for diabetes  03/17/2024   Yearly kidney health urinalysis for diabetes  03/24/2024   Complete foot exam   03/24/2024   Colon Cancer Screening  10/05/2024   Mammogram  11/18/2024   DTaP/Tdap/Td vaccine (2 - Td or Tdap) 10/02/2025   Hepatitis C Screening  Completed   HIV Screening  Completed   Zoster (Shingles) Vaccine  Completed   HPV Vaccine  Aged Out  *Topic was postponed. The date shown is not the original due date.      If you have any questions or concerns, please do not hesitate to call the office at [phone].  I look forward to our next visit and until then take care and stay safe.  Regards,   Glenys Ferrari, MD   [facility]

## 2023-11-25 NOTE — Assessment & Plan Note (Signed)
 Patient expresses difficulty with weight loss through diet and exercise alone. Discussed potential referral to [facility] program. -Provide information on [facility] program for patient consideration.

## 2023-11-25 NOTE — Progress Notes (Signed)
 SUBJECTIVE:   Chief Complaint  Patient presents with   Medical Management of Chronic Issues    2 month follow up   HPI Patient presents for 2 month follow up  Discussed the use of AI scribe software for clinical note transcription with the patient, who gave verbal consent to proceed.  History of Present Illness The patient, with a history of diabetes, high cholesterol, and high blood pressure, presents for a two-month follow-up. They report not having completed the previously ordered labs, including A1c, cholesterol, and kidney function tests. The patient has been checking their blood sugars at home intermittently due to travel, with the results documented by a health nurse. They do not recall the specific values but believe they were under 150.  The patient has not been monitoring their blood pressure at home as they lent their meter to their daughter due to her high blood pressure concerns. They have recently started drinking beet juice twice daily for its potential blood pressure-lowering effects.  The patient also reports a resolved right shoulder pain that they believe was related to their sleeping position. They experienced discomfort upon waking, which would resolve upon moving around.  The patient expresses difficulty with weight loss despite diet and exercise efforts. They are considering starting Ozempic  for diabetes management and potential weight loss benefits, pending lab results and insurance approval. They also express interest in a healthy weight and wellness program.   PERTINENT PMH / PSH: As above  OBJECTIVE:  BP 120/68   Pulse 71   Temp 98 F (36.7 C)   Resp 18   Ht 5' 6 (1.676 m)   Wt 201 lb 2 oz (91.2 kg)   SpO2 97%   BMI 32.46 kg/m    Physical Exam Vitals reviewed.  Constitutional:      General: She is not in acute distress.    Appearance: Normal appearance. She is obese. She is not ill-appearing, toxic-appearing or diaphoretic.  Eyes:     General:         Right eye: No discharge.        Left eye: No discharge.     Conjunctiva/sclera: Conjunctivae normal.  Cardiovascular:     Rate and Rhythm: Normal rate and regular rhythm.     Heart sounds: Normal heart sounds.  Pulmonary:     Effort: Pulmonary effort is normal.     Breath sounds: Normal breath sounds.  Musculoskeletal:        General: Normal range of motion.  Skin:    General: Skin is warm and dry.  Neurological:     General: No focal deficit present.     Mental Status: She is alert and oriented to person, place, and time. Mental status is at baseline.  Psychiatric:        Mood and Affect: Mood normal.        Behavior: Behavior normal.        Thought Content: Thought content normal.        Judgment: Judgment normal.        11/25/2023    8:10 AM 09/25/2023    8:18 AM 03/25/2023   10:13 AM 12/13/2022    8:07 AM 10/24/2022   11:00 AM  Depression screen PHQ 2/9  Decreased Interest 0 0 0 0 0  Down, Depressed, Hopeless 0 0 0 0 0  PHQ - 2 Score 0 0 0 0 0  Altered sleeping 0 0 1 0 0  Tired, decreased energy 0 0 0 0  0  Change in appetite 0 0 2 0 0  Feeling bad or failure about yourself  0 0 0 0 0  Trouble concentrating 0 0 0 0 0  Moving slowly or fidgety/restless 0 0 0 0 0  Suicidal thoughts 0 0 0 0 0  PHQ-9 Score 0 0 3 0 0  Difficult doing work/chores Not difficult at all Not difficult at all Not difficult at all Not difficult at all Not difficult at all      11/25/2023    8:10 AM 09/25/2023    8:18 AM 03/25/2023   10:13 AM 12/13/2022    8:07 AM  GAD 7 : Generalized Anxiety Score  Nervous, Anxious, on Edge 0 0 0 0  Control/stop worrying 0 0 0 0  Worry too much - different things 0 0 0 0  Trouble relaxing 0 0 0 0  Restless 0 0 0 0  Easily annoyed or irritable 0 0 0 0  Afraid - awful might happen 0 0 0 0  Total GAD 7 Score 0 0 0 0  Anxiety Difficulty Not difficult at all Not difficult at all Not difficult at all Not difficult at all    ASSESSMENT/PLAN:   Diet-controlled type 2 diabetes mellitus (HCC) Assessment & Plan: Patient has been checking blood sugars at home intermittently with reported values under 150. A1c and other labs to be drawn today to assess current control and guide decision on initiation of Rybelsus  or Ozempic . -Draw A1c, cholesterol, and kidney function labs today. -Consider initiation of Rybelsus  or Ozempic  based on lab results and insurance approval.  Orders: -     Hemoglobin A1c  Hypertension associated with diabetes [facility]) Assessment & Plan: Patient reports giving home blood pressure monitor to daughter due to her high blood pressure. Current blood pressure in office is well controlled. -Continue current management.  Orders: -     Comprehensive metabolic panel  Hyperlipidemia associated with type 2 diabetes mellitus (HCC) Assessment & Plan: Discussed potential initiation of medication depending on lab results. -Review cholesterol levels once labs are drawn.  Orders: -     Lipid panel  Right shoulder pain, unspecified chronicity Assessment & Plan: Resolved. Likely due to sleeping position. Discussed potential interventions if pain returns. -If pain returns, consider interventions such as heat, ice, diclofenac gel, capsaicin, and physical therapy.   Essential hypertension Assessment & Plan: Patient reports giving home blood pressure monitor to daughter due to her high blood pressure. Current blood pressure in office is well controlled. -Continue current management.   Obesity (BMI 30.0-34.9) Assessment & Plan: Patient expresses difficulty with weight loss through diet and exercise alone. Discussed potential referral to [facility] program. -Provide information on [facility] program for patient consideration.     PDMP reviewed  Return if symptoms worsen or fail to improve, for PCP.  Glenys Ferrari, MD

## 2023-11-25 NOTE — Assessment & Plan Note (Signed)
 Discussed potential initiation of medication depending on lab results. -Review cholesterol levels once labs are drawn.

## 2023-11-25 NOTE — Assessment & Plan Note (Signed)
 Resolved. Likely due to sleeping position. Discussed potential interventions if pain returns. -If pain returns, consider interventions such as heat, ice, diclofenac gel, capsaicin, and physical therapy.

## 2023-11-25 NOTE — Assessment & Plan Note (Signed)
 Patient reports giving home blood pressure monitor to daughter due to her high blood pressure. Current blood pressure in office is well controlled. -Continue current management.

## 2023-11-25 NOTE — Assessment & Plan Note (Signed)
 Patient has been checking blood sugars at home intermittently with reported values under 150. A1c and other labs to be drawn today to assess current control and guide decision on initiation of Rybelsus  or Ozempic . -Draw A1c, cholesterol, and kidney function labs today. -Consider initiation of Rybelsus  or Ozempic  based on lab results and insurance approval.

## 2023-11-26 ENCOUNTER — Other Ambulatory Visit: Payer: Self-pay | Admitting: Family Medicine

## 2023-11-26 DIAGNOSIS — E1169 Type 2 diabetes mellitus with other specified complication: Secondary | ICD-10-CM

## 2023-11-26 DIAGNOSIS — E118 Type 2 diabetes mellitus with unspecified complications: Secondary | ICD-10-CM

## 2023-11-26 DIAGNOSIS — I152 Hypertension secondary to endocrine disorders: Secondary | ICD-10-CM

## 2023-11-26 MED ORDER — SEMAGLUTIDE(0.25 OR 0.5MG/DOS) 2 MG/3ML ~~LOC~~ SOPN: 0.2500 mg | PEN_INJECTOR | SUBCUTANEOUS | 0 refills | Status: DC

## 2023-11-27 ENCOUNTER — Telehealth: Payer: Self-pay

## 2023-11-27 NOTE — Telephone Encounter (Signed)
 Left message to return call to our office.  Okay to read Dr. Sueanne Emerald message to pt.

## 2023-11-27 NOTE — Telephone Encounter (Signed)
-----   Message from Valli Gaw sent at 11/26/2023  3:13 PM EST ----- She is at higher risk with history of diabetes and high blood pressure, recommend to stay on statin. Can discuss at next visit in further detail.  Will send in prescription for Ozempic  0.25 mg weekly.  She will need follow up appointment 4 weeks from  initiation of medication.  Please have scheduled for office visit.

## 2023-12-02 ENCOUNTER — Other Ambulatory Visit (HOSPITAL_COMMUNITY): Payer: Self-pay

## 2023-12-02 ENCOUNTER — Telehealth: Payer: Self-pay | Admitting: Pharmacist

## 2023-12-02 NOTE — Telephone Encounter (Signed)
Pharmacy Patient Advocate Encounter   Received notification from CoverMyMeds that prior authorization for Ozempic (0.25 or 0.5 MG/DOSE) 2MG /3ML pen-injectors is required/requested.   Insurance verification completed.   The patient is insured through [facility] .   Per test claim: PA required; PA submitted to above mentioned insurance via CoverMyMeds Key/confirmation #/EOC [id number] Status is pending

## 2023-12-02 NOTE — Telephone Encounter (Signed)
Pharmacy Patient Advocate Encounter  Received notification from [facility] that Prior Authorization for Ozempic (0.25 or 0.5 MG/DOSE) 2MG /3ML pen-injectors has been DENIED.  Full denial letter will be uploaded to the media tab. See denial reason below.   PA #/Case ID/Reference #: [id number]

## 2023-12-03 ENCOUNTER — Telehealth: Payer: Self-pay

## 2023-12-03 NOTE — Telephone Encounter (Signed)
See previous message

## 2023-12-03 NOTE — Telephone Encounter (Signed)
Copied from CRM [id number]. Topic: Clinical - Prescription Issue >> Dec 03, 2023  9:46 AM Claudine Mouton wrote: Reason for CRM: Patient called to follow up on Ozempic denial letter. Patient advised that she does not want to move forward with Metformin due to risk of side effects/high cholesterol. Patient advised that she's open to trying to pill instead of the injection or Rebelsis(sp?). Patient is willing to move forward with a claim appeal if that's possible. Please assist.

## 2023-12-04 ENCOUNTER — Other Ambulatory Visit: Payer: Self-pay | Admitting: Family Medicine

## 2023-12-04 DIAGNOSIS — E1169 Type 2 diabetes mellitus with other specified complication: Secondary | ICD-10-CM

## 2023-12-04 DIAGNOSIS — E118 Type 2 diabetes mellitus with unspecified complications: Secondary | ICD-10-CM

## 2023-12-04 DIAGNOSIS — E1159 Type 2 diabetes mellitus with other circulatory complications: Secondary | ICD-10-CM

## 2023-12-04 MED ORDER — RYBELSUS 3 MG PO TABS: 3.0000 mg | ORAL_TABLET | Freq: Every day | ORAL | 1 refills | Status: DC

## 2023-12-05 ENCOUNTER — Other Ambulatory Visit (HOSPITAL_COMMUNITY): Payer: Self-pay

## 2023-12-05 NOTE — Telephone Encounter (Signed)
Noted  

## 2023-12-06 ENCOUNTER — Other Ambulatory Visit: Payer: Self-pay

## 2023-12-06 MED ORDER — RYBELSUS 3 MG PO TABS: 3.0000 mg | ORAL_TABLET | Freq: Every day | ORAL | Status: DC

## 2023-12-06 NOTE — Progress Notes (Signed)
Medication Samples have been provided to the patient.  Drug name: Rybelsus     Strength: 3 MG        Qty: 1 box LOT: [id number]  Exp.Date: [id number]   Dosing instructions: Take 1 tablet (3 mg total) by mouth daily   The patient has been instructed regarding the correct time, dose, and frequency of taking this medication, including desired effects and most common side effects.   Tresa Endo  Lenora Gomes 4:16 PM 12/06/2023

## 2024-01-09 ENCOUNTER — Ambulatory Visit: Payer: Self-pay | Admitting: Family Medicine

## 2024-01-09 NOTE — Telephone Encounter (Signed)
 Summary: possible swollen lymphnode   Copied From CRM [id number]. Reason for Triage: Patient reports a painful lump under the chin that appeared yesterday but has lessened in intensity. She is inquiring whether an appointment is needed or if she should see an ENT specialist. Callback # [phone] patient will be unavailable from 9:00-10:00am

## 2024-01-09 NOTE — Telephone Encounter (Signed)
 Pt scheduled with Evelene Croon 01/10/2024 @ 8 am.

## 2024-01-10 ENCOUNTER — Encounter: Payer: Self-pay | Admitting: Nurse Practitioner

## 2024-01-10 ENCOUNTER — Ambulatory Visit: Payer: BC Managed Care – PPO | Admitting: Nurse Practitioner

## 2024-01-10 VITALS — BP 124/74 | HR 64 | Temp 97.6°F | Ht 66.0 in | Wt 192.4 lb

## 2024-01-10 DIAGNOSIS — R599 Enlarged lymph nodes, unspecified: Secondary | ICD-10-CM | POA: Diagnosis not present

## 2024-01-10 NOTE — Progress Notes (Signed)
 Established Patient Office Visit  Subjective:  Patient ID: Nina Bryant, female    DOB: 01-Jan-1961  Age: 63 y.o. MRN: [id number]  CC:  Chief Complaint  Patient presents with   Acute Visit    Lump under Chin since Wednesday while eating lunch Uncomfortable to the touch on Wednesday Denies any pain or itching   Discussed the use of a AI scribe software for clinical note transcription with the patient, who gave verbal consent to proceed.  HPI  Nina Bryant is a 63 year old female present for acute visit for swollen lymph node noticed 01/08/24.   She has a swollen lymph node on the right side of her neck, which she noticed after eating a hamburger on Wednesday.  The swelling was sudden and noticeable, and although it decreased slightly by the following day, it remains palpable.   Her dietary changes include consuming apple cider vinegar, lemon juice, and cinnamon to curb her appetite.  She has a history of acid reflux, which flared up with the use of apple cider vinegar, causing stomach burning. She stopped using the vinegar due to this discomfort. She also increased her citrus intake recently, which may have contributed to her symptoms.  She has history of diabetes and weight gain. She also started natural herbal remedies including herbal tinctures for hot flashes and blood sugar regulation on 01/06/24 and experienced a burning sensation under her tongue when using them.  She has since been diluting the herbal remedies with water.   The lump is tender when touched. No difficulty chewing or pain with jaw movement, or difficulty swallowing.  HPI   Past Medical History:  Diagnosis Date   Annual physical exam 03/11/2020   Anxiety and depression 03/11/2020   Arthritis    left shoulder   BMI 32.0-32.9,adult 03/20/2022   Cataract    ? which eye mild    Cervical arthritis 03/15/2021   03/15/21      COVID-19    11/04/21 sneezing   Diabetes mellitus without complication (HCC)     GERD (gastroesophageal reflux disease)    History of migraine 07/18/2022   Hypertension    Menopause 03/11/2020   Migraines    OSA on CPAP    Prediabetes    Prediabetes 02/04/2019   Sleep apnea    Sneezing 02/04/2019   UTI (urinary tract infection)     Past Surgical History:  Procedure Laterality Date   ABDOMINAL HYSTERECTOMY     fibroids    Family History  Problem Relation Age of Onset   Diabetes Father    Stroke Father    Seizures Father    Heart disease Mother    Diabetes Mother    Other Sister        in 66s legionarres    Cancer Maternal Grandmother        pancreatitic    Other Brother        colon resected ?reason   Breast cancer Neg Hx     Social History   Socioeconomic History   Marital status: Divorced    Spouse name: Not on file   Number of children: Not on file   Years of education: Not on file   Highest education level: Bachelor's degree (e.g., BA, AB, BS)  Occupational History   Not on file  Tobacco Use   Smoking status: Never   Smokeless tobacco: Never  Substance and Sexual Activity   Alcohol use: Yes    Alcohol/week: 0.0 standard drinks  of alcohol    Comment: 1-2 per month   Drug use: Not on file   Sexual activity: Not on file  Other Topics Concern   Not on file  Social History Narrative   Separated as of 2 months 11/2018    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    2 kids daughters   -1 daughter lives in [place] with granddaughter born 04/06/22    Drinks occasionally, never smoker    No guns, wears seat belt    BA degree   Social Drivers of Health   Financial Resource Strain: Low Risk  (11/24/2023)   Overall Financial Resource Strain (CARDIA)    Difficulty of Paying Living Expenses: Not hard at all  Food Insecurity: No Food Insecurity (11/24/2023)   Hunger Vital Sign    Worried About Running Out of Food in the Last Year: Never true    Ran Out of Food in the Last Year: Never true  Transportation Needs: No Transportation Needs  (11/24/2023)   PRAPARE - Administrator, Civil Service (Medical): No    Lack of Transportation (Non-Medical): No  Physical Activity: Insufficiently Active (11/24/2023)   Exercise Vital Sign    Days of Exercise per Week: 2 days    Minutes of Exercise per Session: 10 min  Stress: No Stress Concern Present (11/24/2023)   [employer] of Occupational Health - Occupational Stress Questionnaire    Feeling of Stress : Not at all  Social Connections: Moderately Integrated (11/24/2023)   Social Connection and Isolation Panel [NHANES]    Frequency of Communication with Friends and Family: More than three times a week    Frequency of Social Gatherings with Friends and Family: Once a week    Attends Religious Services: More than 4 times per year    Active Member of [employer] or Organizations: Yes    Attends Engineer, structural: More than 4 times per year    Marital Status: Divorced  Recent Concern: Social Connections - Moderately Isolated (09/24/2023)   Social Connection and Isolation Panel [NHANES]    Frequency of Communication with Friends and Family: More than three times a week    Frequency of Social Gatherings with Friends and Family: Three times a week    Attends Religious Services: More than 4 times per year    Active Member of Clubs or Organizations: No    Attends Engineer, structural: Not on file    Marital Status: Divorced  Intimate Partner Violence: Not on file     Outpatient Medications Prior to Visit  Medication Sig Dispense Refill   Barberry-Oreg Grape-Goldenseal (BERBERINE COMPLEX PO) Take by mouth.     Multiple Vitamin (MULTIVITAMIN) capsule Take 1 capsule by mouth daily.     nebivolol (BYSTOLIC) 5 MG tablet Take 1 tablet (5 mg total) by mouth daily. 90 tablet 3   Semaglutide (RYBELSUS) 3 MG TABS Take 1 tablet (3 mg total) by mouth daily. (Patient not taking: Reported on 01/10/2024) 30 tablet 1   Semaglutide (RYBELSUS) 3 MG TABS Take 1 tablet (3 mg  total) by mouth daily. (Patient not taking: Reported on 01/10/2024)     No facility-administered medications prior to visit.    Allergies  Allergen Reactions   Hydrochlorothiazide     AKI    ROS Review of Systems Negative unless indicated in HPI.    Objective:    Physical Exam Lymphadenopathy:     Head:     Right side of head: Submandibular adenopathy  present.     Comments: R: 1/2 inch mobile lymph node     BP 124/74   Pulse 64   Temp 97.6 F (36.4 C)   Ht 5\' 6"  (1.676 m)   SpO2 93%   BMI 32.46 kg/m  Wt Readings from Last 3 Encounters:  11/25/23 201 lb 2 oz (91.2 kg)  09/25/23 203 lb 6 oz (92.3 kg)  03/25/23 199 lb (90.3 kg)     Health Maintenance  Topic Date Due   COVID-19 Vaccine (2 - 2024-25 season) 07/14/2023   INFLUENZA VACCINE  02/10/2024 (Originally 06/13/2023)   Cervical Cancer Screening (HPV/Pap Cotest)  09/29/2024 (Originally 01/31/1989)   Pneumococcal Vaccine 45-33 Years old (1 of 2 - PCV) 11/24/2024 (Originally 01/31/1965)   OPHTHALMOLOGY EXAM  03/07/2024   Diabetic kidney evaluation - Urine ACR  03/24/2024   FOOT EXAM  03/24/2024   HEMOGLOBIN A1C  05/24/2024   Colonoscopy  10/05/2024   MAMMOGRAM  11/18/2024   Diabetic kidney evaluation - eGFR measurement  11/24/2024   DTaP/Tdap/Td (2 - Td or Tdap) 10/02/2025   Hepatitis C Screening  Completed   HIV Screening  Completed   Zoster Vaccines- Shingrix  Completed   HPV VACCINES  Aged Out    There are no preventive care reminders to display for this patient.  Lab Results  Component Value Date   TSH 1.53 03/18/2023   Lab Results  Component Value Date   WBC 5.1 10/31/2022   HGB 12.9 10/31/2022   HCT 38.4 10/31/2022   MCV 88.4 10/31/2022   PLT 211.0 10/31/2022   Lab Results  Component Value Date   NA 139 11/25/2023   K 4.0 11/25/2023   CO2 26 11/25/2023   GLUCOSE 112 (H) 11/25/2023   BUN 12 11/25/2023   CREATININE 0.86 11/25/2023   BILITOT 0.5 11/25/2023   ALKPHOS 81 11/25/2023   AST  16 11/25/2023   ALT 14 11/25/2023   PROT 7.3 11/25/2023   ALBUMIN 4.6 11/25/2023   CALCIUM 9.5 11/25/2023   GFR 71.20 11/25/2023   Lab Results  Component Value Date   CHOL 185 11/25/2023   Lab Results  Component Value Date   HDL 41.70 11/25/2023   Lab Results  Component Value Date   LDLCALC 111 (H) 11/25/2023   Lab Results  Component Value Date   TRIG 161.0 (H) 11/25/2023   Lab Results  Component Value Date   CHOLHDL 4 11/25/2023   Lab Results  Component Value Date   HGBA1C 6.7 (H) 11/25/2023      Assessment & Plan:  Swelling of lymph node Assessment & Plan: Half inches  slightly tender mobile submandibular lymph node swelling. Pt noticed the swelling has been decreased. Has recently started herbal tinctures. Denies any difficulty chewing, swallowing or pain with jaw movements.  -Advised to stop herbal product and citrus product.  -Apply warm compresses to the area. -Close follow up, if needed would perform imaging for further evaluation.     Red flag discussed.  Patient was provided clear instructions to go to ER or urgent care if symptoms does not improve, red flag or new problem develops.    Follow-up: Return in about 4 days (around 01/14/2024), or pcp or me.   Kara Dies, NP

## 2024-01-10 NOTE — Assessment & Plan Note (Signed)
 Half inches  slightly tender mobile submandibular lymph node swelling. Pt noticed the swelling has been decreased. Has recently started herbal tinctures. Denies any difficulty chewing, swallowing or pain with jaw movements.  -Advised to stop herbal product and citrus product.  -Apply warm compresses to the area. -Close follow up, if needed would perform imaging for further evaluation.

## 2024-01-10 NOTE — Patient Instructions (Addendum)
 Stop herbal product and citrus product.  Apply warm compresses to the area. Seek emergency care if have trouble swallowing, breathing or severe pain.

## 2024-01-14 ENCOUNTER — Ambulatory Visit: Payer: BC Managed Care – PPO | Admitting: Family Medicine

## 2024-02-06 ENCOUNTER — Other Ambulatory Visit: Payer: Self-pay | Admitting: Family Medicine

## 2024-02-06 ENCOUNTER — Encounter: Payer: Self-pay | Admitting: Family Medicine

## 2024-02-06 DIAGNOSIS — E1159 Type 2 diabetes mellitus with other circulatory complications: Secondary | ICD-10-CM

## 2024-02-06 DIAGNOSIS — E1169 Type 2 diabetes mellitus with other specified complication: Secondary | ICD-10-CM

## 2024-02-06 DIAGNOSIS — E118 Type 2 diabetes mellitus with unspecified complications: Secondary | ICD-10-CM

## 2024-02-06 MED ORDER — RYBELSUS 3 MG PO TABS: 3.0000 mg | ORAL_TABLET | Freq: Every day | ORAL | 3 refills | Status: DC

## 2024-03-23 ENCOUNTER — Encounter: Payer: Self-pay | Admitting: Family Medicine

## 2024-03-23 ENCOUNTER — Encounter

## 2024-03-23 NOTE — Telephone Encounter (Signed)
 Spoke with pt to schedule her an appointment and she stated that she does not have time to come in or do a virtual visit. Pt stated that she flew to and from [place] last week. On Tuesday last week she started developing congestion, sneezing and sore throat. Pt stated that this past Saturday she developed a cough, the congestion is clear and only in her head not in her chest. Pt would like to know of a OTC cough medication that you would recommend for her.

## 2024-03-26 ENCOUNTER — Telehealth (INDEPENDENT_AMBULATORY_CARE_PROVIDER_SITE_OTHER): Admitting: Nurse Practitioner

## 2024-03-26 ENCOUNTER — Encounter: Payer: Self-pay | Admitting: Nurse Practitioner

## 2024-03-26 DIAGNOSIS — R059 Cough, unspecified: Secondary | ICD-10-CM | POA: Insufficient documentation

## 2024-03-26 DIAGNOSIS — R058 Other specified cough: Secondary | ICD-10-CM

## 2024-03-26 MED ORDER — AZITHROMYCIN 250 MG PO TABS: ORAL_TABLET | ORAL | 0 refills | Status: DC

## 2024-03-26 MED ORDER — BENZONATATE 100 MG PO CAPS: 100.0000 mg | ORAL_CAPSULE | Freq: Two times a day (BID) | ORAL | 1 refills | Status: DC | PRN

## 2024-03-26 NOTE — Assessment & Plan Note (Signed)
 Symptoms persistent over a week, no fever or chest pain. Over-the-counter treatments ineffective. - Will treat with azithromycin  and Tessalon Perles - Advised honey for cough relief. - Continue to use Mucinex. - Let us  know if symptoms not improving.

## 2024-03-26 NOTE — Patient Instructions (Signed)
 Start taking azithromycin : two tablets on the first day, then one tablet daily for the next four days. -Take Tessalon Perles for your cough, 2-3  times a days as needed. -Use honey if not allergic, to help soothe your cough. -Let us  know if symptoms not improving.

## 2024-03-26 NOTE — Progress Notes (Signed)
 Virtual Visit via Video Note  I connected with Nina Bryant on 03/26/24 at 4:21 PM by a video enabled telemedicine application and verified that I am speaking with the correct person using two identifiers.  Patient Location: Home Provider Location: Office/Clinic  I discussed the limitations, risks, security, and privacy concerns of performing an evaluation and management service by video and the availability of in person appointments. I also discussed with the patient that there may be a patient responsible charge related to this service. The patient expressed understanding and agreed to proceed.  Subjective: PCP: Valli Gaw, MD  Chief Complaint  Patient presents with   Acute Visit    Persistent cough   HPI Discussed the use of a AI scribe software for clinical note transcription with the patient, who gave verbal consent to proceed.   Nina Bryant "Nina Bryant" is a 63 year old female who presents with persistent cough and head congestion.  Her symptoms began with a sore throat last Tuesday, which resolved by Wednesday, followed by sneezing. By Saturday, she lost her voice and developed violent coughing spells, which have persisted. She experiences head congestion and a cough that is sometimes dry and sometimes productive, with phlegm that is occasionally brownish to clear. She has tried Mucinex and Delsym without relief. She denies sore throat, chest pain, ear pressure, fever, or chest congestion. She drinks approximately 64 ounces of water daily.  ROS: Per HPI  Current Outpatient Medications:    azithromycin  (ZITHROMAX ) 250 MG tablet, Take 2 tablets on day 1, then 1 tablet daily on days 2 through 5, Disp: 6 tablet, Rfl: 0   Barberry-Oreg Grape-Goldenseal (BERBERINE COMPLEX PO), Take by mouth., Disp: , Rfl:    benzonatate (TESSALON PERLES) 100 MG capsule, Take 1 capsule (100 mg total) by mouth 2 (two) times daily as needed., Disp: 30 capsule, Rfl: 1   nebivolol  (BYSTOLIC ) 5  MG tablet, Take 1 tablet (5 mg total) by mouth daily., Disp: 90 tablet, Rfl: 3   Multiple Vitamin (MULTIVITAMIN) capsule, Take 1 capsule by mouth daily. (Patient not taking: Reported on 03/26/2024), Disp: , Rfl:    Semaglutide  (RYBELSUS ) 3 MG TABS, Take 1 tablet (3 mg total) by mouth daily. (Patient not taking: Reported on 03/26/2024), Disp: 30 tablet, Rfl: 3  Observations/Objective: Today's Vitals   Physical Exam Constitutional:      General: She is not in acute distress.    Appearance: Normal appearance. She is not ill-appearing.  Eyes:     Conjunctiva/sclera: Conjunctivae normal.  Pulmonary:     Effort: No respiratory distress.  Neurological:     Mental Status: She is alert.  Psychiatric:        Mood and Affect: Mood normal.        Behavior: Behavior normal.        Thought Content: Thought content normal.     Assessment and Plan: Other cough Assessment & Plan: Symptoms persistent over a week, no fever or chest pain. Over-the-counter treatments ineffective. - Will treat with azithromycin  and Tessalon Perles - Advised honey for cough relief. - Continue to use Mucinex. - Let us  know if symptoms not improving.    Other orders -     Azithromycin ; Take 2 tablets on day 1, then 1 tablet daily on days 2 through 5  Dispense: 6 tablet; Refill: 0 -     Benzonatate; Take 1 capsule (100 mg total) by mouth 2 (two) times daily as needed.  Dispense: 30 capsule; Refill: 1  Follow Up Instructions: Return if symptoms worsen or fail to improve.   I discussed the assessment and treatment plan with the patient. The patient was provided an opportunity to ask questions, and all were answered. The patient agreed with the plan and demonstrated an understanding of the instructions.   The patient was advised to call back or seek an in-person evaluation if the symptoms worsen or if the condition fails to improve as anticipated.  The above assessment and management plan was discussed with the  patient. The patient verbalized understanding of and has agreed to the management plan.   Fenton Candee, NP

## 2024-03-31 ENCOUNTER — Ambulatory Visit: Payer: Self-pay

## 2024-03-31 ENCOUNTER — Ambulatory Visit (INDEPENDENT_AMBULATORY_CARE_PROVIDER_SITE_OTHER)

## 2024-03-31 ENCOUNTER — Telehealth: Payer: Self-pay

## 2024-03-31 VITALS — BP 150/80 | HR 84 | Ht 66.0 in | Wt 208.8 lb

## 2024-03-31 DIAGNOSIS — H6993 Unspecified Eustachian tube disorder, bilateral: Secondary | ICD-10-CM | POA: Insufficient documentation

## 2024-03-31 DIAGNOSIS — R051 Acute cough: Secondary | ICD-10-CM

## 2024-03-31 MED ORDER — GUAIFENESIN-CODEINE 100-10 MG/5ML PO SOLN: 10.0000 mL | Freq: Three times a day (TID) | ORAL | 0 refills | Status: AC | PRN

## 2024-03-31 MED ORDER — FLUTICASONE PROPIONATE 50 MCG/ACT NA SUSP: 2.0000 | Freq: Every day | NASAL | 0 refills | Status: DC

## 2024-03-31 NOTE — Assessment & Plan Note (Signed)
-   Fluticasone /Flonase  50 mcg/act nasal spray. Place 2 sprays into both nostrils daily for 10 days then prn.

## 2024-03-31 NOTE — Telephone Encounter (Signed)
 Copied from CRM [id number]. Topic: Appointments - Transfer of Care >> Mar 31, 2024 11:18 AM Stanly Early wrote: Pt is requesting to transfer FROM: Nina Bryant,Nina Bryant Pt is requesting to transfer TO: [name] Reason for requested transfer: Shellye Dibble is leaving the practice It is the responsibility of the team the patient would like to transfer to (Dr. [name]) to reach out to the patient if for any reason this transfer is not acceptable.  I left a voicemail for patient letting her know that we did receive her message.  I let patient know that she will need to call [facility] directly to schedule her appointment with Dr. [name], as we cannot schedule for their office.  I gave her their phone number [phone]).

## 2024-03-31 NOTE — Progress Notes (Signed)
 Acute Office Visit  Subjective:    Patient ID: Nina Bryant, female    DOB: 1960-11-25, 63 y.o.   MRN: [id number]  Chief Complaint  Patient presents with   Cough   Cough Chronicity: cough for about 3 weeks. Progression since onset: Since cough started patient reports cough was initially better when she took Mucinex, Delsym but persistant now. Episode frequency: Cough not constant in nature but when she gets it she seems to not be able to stop coughing. Cough characteristics: Reports of yellow phlegm. Associated symptoms include ear congestion (both sides, also travelled recently). Pertinent negatives include no chest pain, myalgias, postnasal drip, sore throat or shortness of breath. Exacerbated by: talking. Risk factors for lung disease include travel. Treatments tried: Azithromycine last dose on 03/30/24. Benzonatate . Reports cough  is better on this but not fully resolved. There is no history of asthma or COPD.     Review of Systems  HENT:  Negative for postnasal drip and sore throat.   Respiratory:  Positive for cough. Negative for shortness of breath.   Cardiovascular:  Negative for chest pain.  Musculoskeletal:  Negative for myalgias.   As per HPI    Objective:    BP (!) 150/80   Pulse 84   Ht 5\' 6"  (1.676 m)   Wt 208 lb 12.8 oz (94.7 kg)   SpO2 97%   BMI 33.70 kg/m    Physical Exam Constitutional:      Appearance: Normal appearance.  HENT:     Head: Normocephalic and atraumatic.     Right Ear: Tympanic membrane is bulging.     Left Ear: Tympanic membrane is bulging.     Mouth/Throat:     Mouth: Mucous membranes are moist.     Pharynx: Uvula midline.     Tonsils: No tonsillar exudate.  Neck:     Thyroid : No thyroid  mass or thyroid  tenderness.  Cardiovascular:     Rate and Rhythm: Normal rate and regular rhythm.  Pulmonary:     Effort: Pulmonary effort is normal.     Breath sounds: Normal breath sounds. No wheezing.  Abdominal:     General: Bowel sounds  are normal.     Palpations: Abdomen is soft.  Musculoskeletal:     Cervical back: Neck supple. No rigidity.     Right lower leg: No edema.     Left lower leg: No edema.  Skin:    General: Skin is warm.  Neurological:     Mental Status: She is alert and oriented to person, place, and time.  Psychiatric:        Mood and Affect: Mood normal.        Behavior: Behavior normal.     No results found for any visits on 03/31/24.     Assessment & Plan:  Acute cough Assessment & Plan: D/d includes viral URI, pneumonia, bronchitis, sinusitis. Recommend chest x/ray to r/o atypical pneumonia, reactive airway.  If pneumonia rx with antibiotic. If not pneumonia, recommend treatment with Codeine+Guaifenesin 10-20 mg 2-4 times a day prn for about 5 days. If cough persists despite that recommend further f/u.  Patient's BP elevated today, likely from ongoing cough, recommend f/u appointment to evaluate elevated BP.    Orders: -     DG Chest 2 View  Dysfunction of both eustachian tubes Assessment & Plan: - Fluticasone /Flonase  50 mcg/act nasal spray. Place 2 sprays into both nostrils daily for 10 days then prn.    Orders: -  Fluticasone  Propionate; Place 2 sprays into both nostrils daily.  Dispense: 9.9 mL; Refill: 0    Return if symptoms worsen or fail to improve.  Jacklin Mascot, MD

## 2024-03-31 NOTE — Telephone Encounter (Signed)
 Still having cough and congestion - finished some meds and thinks she needs to be seen in person - appt made for today at 4 pm [facility]     Copied from CRM [id number]. Topic: Appointments - Appointment Info/Confirmation >> Mar 31, 2024 11:30 AM Howard Macho wrote: Patient/patient representative is calling for information regarding an appointment.  Patient is returning a call to the cma kelly CB [phone]

## 2024-03-31 NOTE — Assessment & Plan Note (Signed)
 D/d includes viral URI, pneumonia, bronchitis, sinusitis. Recommend chest x/ray to r/o atypical pneumonia, reactive airway.  If pneumonia rx with antibiotic. If not pneumonia, recommend treatment with Codeine+Guaifenesin 10-20 mg 2-4 times a day prn for about 5 days. If cough persists despite that recommend further f/u.  Patient's BP elevated today, likely from ongoing cough, recommend f/u appointment to evaluate elevated BP.

## 2024-04-09 ENCOUNTER — Encounter: Admitting: Family Medicine

## 2024-04-11 LAB — AMB RESULTS CONSOLE CBG: Glucose: 110

## 2024-04-11 NOTE — Progress Notes (Signed)
Declined SDOH.

## 2024-04-21 ENCOUNTER — Ambulatory Visit

## 2024-04-21 VITALS — BP 118/82 | HR 67 | Temp 97.9°F | Ht 66.0 in | Wt 206.4 lb

## 2024-04-21 DIAGNOSIS — K219 Gastro-esophageal reflux disease without esophagitis: Secondary | ICD-10-CM

## 2024-04-21 DIAGNOSIS — E1169 Type 2 diabetes mellitus with other specified complication: Secondary | ICD-10-CM

## 2024-04-21 DIAGNOSIS — G4733 Obstructive sleep apnea (adult) (pediatric): Secondary | ICD-10-CM

## 2024-04-21 DIAGNOSIS — E66811 Obesity, class 1: Secondary | ICD-10-CM

## 2024-04-21 DIAGNOSIS — I10 Essential (primary) hypertension: Secondary | ICD-10-CM | POA: Diagnosis not present

## 2024-04-21 DIAGNOSIS — E119 Type 2 diabetes mellitus without complications: Secondary | ICD-10-CM

## 2024-04-21 DIAGNOSIS — R051 Acute cough: Secondary | ICD-10-CM | POA: Diagnosis not present

## 2024-04-21 DIAGNOSIS — R3915 Urgency of urination: Secondary | ICD-10-CM

## 2024-04-21 DIAGNOSIS — E785 Hyperlipidemia, unspecified: Secondary | ICD-10-CM

## 2024-04-21 MED ORDER — PANTOPRAZOLE SODIUM 20 MG PO TBEC: 20.0000 mg | DELAYED_RELEASE_TABLET | Freq: Every day | ORAL | 0 refills | Status: DC

## 2024-04-21 MED ORDER — BENZONATATE 200 MG PO CAPS
200.0000 mg | ORAL_CAPSULE | Freq: Three times a day (TID) | ORAL | 0 refills | Status: AC | PRN
Start: 2024-04-21 — End: ?

## 2024-04-21 NOTE — Patient Instructions (Addendum)
-   Take Protonix 20 mg, on empty stomach and please do not eat anything for 30 min after you take this medication. Do this daily for one week. If cough persists you can try Tessalon  200 mg, three times a day for one week. If this does not resolve cough, we may want to do a breathing test called PFT to see why you persists to have this cough.   - Labs on Monday, fasting recommended.   - We will update your pneumonia immunization during your follow up visit.

## 2024-04-21 NOTE — Assessment & Plan Note (Signed)
 Check UA to r/o UTI. Likely related to acute cough.

## 2024-04-21 NOTE — Assessment & Plan Note (Addendum)
 On Nebivolol  5 mg once a day. Continue. BP goal <130/80 mmHg. Check CMP.

## 2024-04-21 NOTE — Assessment & Plan Note (Addendum)
 Obtain A1c, plans on doing this next Monday.  If above goal consider adding Metformin.  Emphasize eating healthy, cutting sugar, carbohydrate rich food. Exercise: Aim for 150 minutes of moderate aerobic activity weekly plus strength training twice a week. needed. Check urine microalbumin level.

## 2024-04-21 NOTE — Assessment & Plan Note (Addendum)
 Not on statin. Discussed in detail about r/o ASCVD and benefits/ and potential s/e of statin. Check Lipid panel and start on moderate intensity therapy once lab result is back. Continue lifestyle modifications as mentioned for type II DM.

## 2024-04-21 NOTE — Assessment & Plan Note (Addendum)
 Realistic weight loss measures including pharmacological/non pharmacological interventions were discussed.  Recommend starting extensive lifestyle modifications with a balanced diet and regular physical activity with consideration for pharmacotherapy in the future.

## 2024-04-21 NOTE — Progress Notes (Addendum)
 Established Patient Office Visit.  TOC from Dr. Sueanne Emerald.  Last OV with me on 03/31/24 for acute cough.  Last OV with Dr. Sueanne Emerald on 11/25/23.    Subjective  Patient ID: Nina Bryant, female    DOB: [birth year]  Age: 63 y.o. MRN: [id number]  Chief Complaint  Patient presents with   Transitions Of Care   Cough    She  has a past medical history of Annual physical exam (03/11/2020), Anxiety and depression (03/11/2020), Arthritis, BMI 32.0-32.9,adult (03/20/2022), Cataract, Cervical arthritis (03/15/2021), COVID-19, Diabetes mellitus without complication (HCC), GERD (gastroesophageal reflux disease), History of migraine (07/18/2022), Hypertension, Menopause (03/11/2020), Migraines, OSA on CPAP, Prediabetes, Prediabetes (02/04/2019), Right shoulder pain (09/30/2023), Sleep apnea, Sneezing (02/04/2019), and UTI (urinary tract infection).  HPI 1) Acute cough: was seen on 03/31/24, treated with odeine and guaifenesin  (filled on 04/03/24) and nasal Flonase . X-ray was negative for pneumonia.  Since her last visit, she reports cough persistent with posttussis episodes. She has staying hydrated and has noted she is peeing more often. She has had urges to pee.  She denies chest pain, wheezy sound, no lower leg swelling, fever. Patient travels for work, has been travelling locally.  Talking, laying down worsens the cough as well.   Smoking h/o: Smoked for less than a year when she was in her teens.   GERD h/o: Gets bitter taste in mouth with certain food like mint, spicy food, lettuce. She was eating spicy food about a month ago which she is not consuming now.   Guaifenesin -codeine : Did not help.  Benzonatate : 2-3 times a day helped.  Coricidin: Helped with cough.   2) Diet controlled diabetes: Last A1c: 11/25/23: 6.7%  Urine microalbumin: Negative on 03/25/23 LDL: 111 on 11/25/23, not on statin.  Eye exam: [facility], last eye exam couple months ago.  Not on medication.   3) Hypertension:  On Nebivolol  5 mg once a day.   4) Hyperlipidemia: Last lipid panel 11/25/23: 111 mg/dl. 32.5% ASCVD 10 yrs risk. She take Coq10. Would prefer natural measures to improve this.   5) OSA: Uses CPAP, changed CPAP supplies. Has not seen a sleep specialist for the last few years.   6) Preventative:  Due for pneumonia immunization.   7) Obesity:  Would like to be on weight loss medications. Has tried liposuction,  abdominoplasty in the past. Patient believes she can improve diet, exercise.   ROS As per HPI    Objective:      BP 118/82   Pulse 67   Temp 97.9 F (36.6 C) (Oral)   Ht 5\' 6"  (1.676 m)   Wt 206 lb 6.4 oz (93.6 kg)   SpO2 97%   BMI 33.31 kg/m      04/21/2024    9:10 AM 01/10/2024    8:16 AM 11/25/2023    8:10 AM  Depression screen PHQ 2/9  Decreased Interest 0 0 0  Down, Depressed, Hopeless 0 0 0  PHQ - 2 Score 0 0 0  Altered sleeping 0 0 0  Tired, decreased energy 0 0 0  Change in appetite 0 0 0  Feeling bad or failure about yourself  0 0 0  Trouble concentrating 0 0 0  Moving slowly or fidgety/restless 0 0 0  Suicidal thoughts 0 0 0  PHQ-9 Score 0 0 0  Difficult doing work/chores Not difficult at all Not difficult at all Not difficult at all      04/21/2024    9:10 AM 01/10/2024  8:16 AM 11/25/2023    8:10 AM 09/25/2023    8:18 AM  GAD 7 : Generalized Anxiety Score  Nervous, Anxious, on Edge 0 0 0 0  Control/stop worrying 0 0 0 0  Worry too much - different things 0 0 0 0  Trouble relaxing 0 0 0 0  Restless 0 0 0 0  Easily annoyed or irritable 0 0 0 0  Afraid - awful might happen 0 0 0 0  Total GAD 7 Score 0 0 0 0  Anxiety Difficulty Not difficult at all Not difficult at all Not difficult at all Not difficult at all      04/21/2024    9:10 AM 01/10/2024    8:16 AM 11/25/2023    8:10 AM  Depression screen PHQ 2/9  Decreased Interest 0 0 0  Down, Depressed, Hopeless 0 0 0  PHQ - 2 Score 0 0 0  Altered sleeping 0 0 0  Tired, decreased energy 0 0  0  Change in appetite 0 0 0  Feeling bad or failure about yourself  0 0 0  Trouble concentrating 0 0 0  Moving slowly or fidgety/restless 0 0 0  Suicidal thoughts 0 0 0  PHQ-9 Score 0 0 0  Difficult doing work/chores Not difficult at all Not difficult at all Not difficult at all      04/21/2024    9:10 AM 01/10/2024    8:16 AM 11/25/2023    8:10 AM 09/25/2023    8:18 AM  GAD 7 : Generalized Anxiety Score  Nervous, Anxious, on Edge 0 0 0 0  Control/stop worrying 0 0 0 0  Worry too much - different things 0 0 0 0  Trouble relaxing 0 0 0 0  Restless 0 0 0 0  Easily annoyed or irritable 0 0 0 0  Afraid - awful might happen 0 0 0 0  Total GAD 7 Score 0 0 0 0  Anxiety Difficulty Not difficult at all Not difficult at all Not difficult at all Not difficult at all   SDOH Screenings   Food Insecurity: Patient Declined (04/11/2024)  Housing: Patient Declined (04/11/2024)  Transportation Needs: Patient Declined (04/11/2024)  Utilities: Patient Declined (04/11/2024)  Alcohol Screen: Low Risk  (11/24/2023)  Depression (PHQ2-9): Low Risk  (04/21/2024)  Financial Resource Strain: Low Risk  (11/24/2023)  Physical Activity: Insufficiently Active (11/24/2023)  Social Connections: Moderately Integrated (11/24/2023)  Recent Concern: Social Connections - Moderately Isolated (09/24/2023)  Stress: No Stress Concern Present (11/24/2023)  Tobacco Use: Low Risk  (04/21/2024)     Physical Exam HENT:     Head: Normocephalic and atraumatic.     Right Ear: Tympanic membrane normal.     Left Ear: Tympanic membrane normal.     Mouth/Throat:     Mouth: Mucous membranes are moist.  Eyes:     Pupils: Pupils are equal, round, and reactive to light.  Cardiovascular:     Rate and Rhythm: Normal rate.  Pulmonary:     Effort: Pulmonary effort is normal.     Breath sounds: Normal breath sounds. No wheezing or rales.  Abdominal:     General: Bowel sounds are normal.     Palpations: Abdomen is soft.      Tenderness: There is no abdominal tenderness. There is no guarding.  Musculoskeletal:     Cervical back: Normal range of motion. No rigidity.     Right lower leg: No edema.     Left lower leg: No  edema.  Lymphadenopathy:     Cervical: No cervical adenopathy.  Skin:    General: Skin is warm.  Neurological:     Mental Status: She is oriented to person, place, and time.  Psychiatric:        Mood and Affect: Mood normal.        No results found for any visits on 04/21/24.  The 10-year ASCVD risk score (Arnett DK, et al., 2019) is: 17.1%     Assessment & Plan:   Acute cough Assessment & Plan: Chest x-ray 03/31/24: Negative for pneumonia.  Normal exam today.  Trial of Protonix 20 mg daily for 1-2 weeks.  If above does not resolve try Tessalon  200 mg, three times a day for one week.  If this does not resolve cough, consider PFT.   Plans on updating pneumonia immunization during f/u visit.   Orders: -     Pantoprazole Sodium; Take 1 tablet (20 mg total) by mouth daily.  Dispense: 30 tablet; Refill: 0 -     Benzonatate ; Take 1 capsule (200 mg total) by mouth 3 (three) times daily as needed for cough.  Dispense: 21 capsule; Refill: 0  Diet-controlled type 2 diabetes mellitus (HCC) Assessment & Plan: Obtain A1c, plans on doing this next Monday.  If above goal consider adding Metformin.  Emphasize eating healthy, cutting sugar, carbohydrate rich food. Exercise: Aim for 150 minutes of moderate aerobic activity weekly plus strength training twice a week. needed. Check urine microalbumin level.   Orders: -     Hemoglobin A1c; Future -     Microalbumin / creatinine urine ratio; Future  Gastroesophageal reflux disease without esophagitis Assessment & Plan: Trial of Protonix 20 mg daily for 1-2 weeks.    Hyperlipidemia associated with type 2 diabetes mellitus (HCC) Assessment & Plan: Not on statin. Discussed in detail about r/o ASCVD and benefits/ and potential s/e of statin.  Check Lipid panel and start on moderate intensity therapy once lab result is back. Continue lifestyle modifications as mentioned for type II DM.    OSA on CPAP Assessment & Plan: Recommend establishing care of sleep specialist. Referral to pulm made today.   Orders: -     Pulmonary Visit  Urinary urgency Assessment & Plan: Check UA to r/o UTI. Likely related to acute cough.   Orders: -     Urinalysis, Routine w reflex microscopic; Future  Essential hypertension Assessment & Plan: On Nebivolol  5 mg once a day. Continue. BP goal <130/80 mmHg. Check CMP.   Orders: -     Comprehensive metabolic panel with GFR; Future  Encounter for diabetic foot exam (HCC) -     [facility] Diabetes Foot Exam  Obesity (BMI 30.0-34.9) Assessment & Plan: Realistic weight loss measures including pharmacological/non pharmacological interventions were discussed.  Recommend starting extensive lifestyle modifications with a balanced diet and regular physical activity with consideration for pharmacotherapy in the future.      I spent 45 minutes on the day of this face-to-face encounter reviewing the patient's medical and surgical history, medications, ongoing concerns, and reviewing the assessment and plan, referral with the patient. This time also included counseling the patient on their health conditions and management options. Additionally, I spent time post-visit ordering and reviewing diagnostics and therapeutics with the patient.  Return for Labs on Monday 04/27/24 . F/U to be determined based on lab results.   Jacklin Mascot, MD

## 2024-04-21 NOTE — Assessment & Plan Note (Signed)
 Recommend establishing care of sleep specialist. Referral to pulm made today.

## 2024-04-21 NOTE — Assessment & Plan Note (Signed)
 Trial of Protonix 20 mg daily for 1-2 weeks.

## 2024-04-21 NOTE — Assessment & Plan Note (Signed)
 Chest x-ray 03/31/24: Negative for pneumonia.  Normal exam today.  Trial of Protonix 20 mg daily for 1-2 weeks.  If above does not resolve try Tessalon  200 mg, three times a day for one week.  If this does not resolve cough, consider PFT.   Plans on updating pneumonia immunization during f/u visit.

## 2024-04-22 ENCOUNTER — Other Ambulatory Visit: Payer: Self-pay

## 2024-04-22 DIAGNOSIS — H6993 Unspecified Eustachian tube disorder, bilateral: Secondary | ICD-10-CM

## 2024-04-27 ENCOUNTER — Ambulatory Visit (INDEPENDENT_AMBULATORY_CARE_PROVIDER_SITE_OTHER): Admitting: Internal Medicine

## 2024-04-27 ENCOUNTER — Ambulatory Visit (INDEPENDENT_AMBULATORY_CARE_PROVIDER_SITE_OTHER): Admitting: Sleep Medicine

## 2024-04-27 ENCOUNTER — Encounter: Payer: Self-pay | Admitting: Sleep Medicine

## 2024-04-27 ENCOUNTER — Other Ambulatory Visit: Payer: Self-pay | Admitting: Internal Medicine

## 2024-04-27 ENCOUNTER — Encounter: Payer: Self-pay | Admitting: Internal Medicine

## 2024-04-27 VITALS — BP 128/82 | HR 66 | Temp 97.7°F | Ht 66.0 in | Wt 205.0 lb

## 2024-04-27 VITALS — BP 120/88 | HR 61 | Temp 98.3°F | Ht 66.0 in | Wt 204.6 lb

## 2024-04-27 DIAGNOSIS — E669 Obesity, unspecified: Secondary | ICD-10-CM

## 2024-04-27 DIAGNOSIS — E1169 Type 2 diabetes mellitus with other specified complication: Secondary | ICD-10-CM

## 2024-04-27 DIAGNOSIS — I10 Essential (primary) hypertension: Secondary | ICD-10-CM | POA: Diagnosis not present

## 2024-04-27 DIAGNOSIS — R3915 Urgency of urination: Secondary | ICD-10-CM | POA: Diagnosis not present

## 2024-04-27 DIAGNOSIS — Z0000 Encounter for general adult medical examination without abnormal findings: Secondary | ICD-10-CM | POA: Diagnosis not present

## 2024-04-27 DIAGNOSIS — G4733 Obstructive sleep apnea (adult) (pediatric): Secondary | ICD-10-CM | POA: Diagnosis not present

## 2024-04-27 DIAGNOSIS — E119 Type 2 diabetes mellitus without complications: Secondary | ICD-10-CM | POA: Diagnosis not present

## 2024-04-27 MED ORDER — ZEPBOUND 2.5 MG/0.5ML ~~LOC~~ SOAJ: 2.5000 mg | SUBCUTANEOUS | 1 refills | Status: AC

## 2024-04-27 NOTE — Progress Notes (Addendum)
 Subjective:    Nina Bryant is a 63 y.o. female who presents for a Welcome to Medicare exam.   Cardiac Risk Factors include: advanced age (>36men, >64 women);hypertension;obesity (BMI >30kg/m2)      Objective:    Today's Vitals   04/27/24 0837 04/27/24 0847  BP: 128/82   Pulse: 66   Temp: 97.7 F (36.5 C)   SpO2: 96%   Weight: 205 lb (93 kg)   Height: 5' 6 (1.676 m)   PainSc:  2   Body mass index is 33.09 kg/m.  Medications Outpatient Encounter Medications as of 04/27/2024  Medication Sig   Barberry-Oreg Grape-Goldenseal (BERBERINE COMPLEX PO) Take by mouth.   benzonatate  (TESSALON ) 200 MG capsule Take 1 capsule (200 mg total) by mouth 3 (three) times daily as needed for cough.   fluticasone  (FLONASE ) 50 MCG/ACT nasal spray Place 1 spray into both nostrils daily.   nebivolol  (BYSTOLIC ) 5 MG tablet Take 1 tablet (5 mg total) by mouth daily.   pantoprazole  (PROTONIX ) 20 MG tablet Take 1 tablet (20 mg total) by mouth daily.   No facility-administered encounter medications on file as of 04/27/2024.     History: Past Medical History:  Diagnosis Date   Annual physical exam 03/11/2020   Anxiety and depression 03/11/2020   ((Pt Qnr Sub: from Divorce))    Arthritis    left shoulder   BMI 32.0-32.9,adult 03/20/2022   Cataract    ? which eye mild    Cervical arthritis 03/15/2021   03/15/21      COVID-19    11/04/21 sneezing   Diabetes mellitus without complication (HCC)    GERD (gastroesophageal reflux disease)    Only when diet changes   History of migraine 07/18/2022   Hypertension    Menopause 03/11/2020   Migraines    OSA on CPAP    Prediabetes    Prediabetes 02/04/2019   Right shoulder pain 09/30/2023   Sleep apnea    Sneezing 02/04/2019   UTI (urinary tract infection)    Past Surgical History:  Procedure Laterality Date   ABDOMINAL HYSTERECTOMY     fibroids   ABDOMINOPLASTY      Family History  Problem Relation Age of Onset   Diabetes Father     Stroke Father    Seizures Father    Heart disease Mother    Diabetes Mother    Other Sister        in 37s legionarres    COPD Brother    Cancer Maternal Grandmother        pancreatitic    Other Brother        colon resected ?reason   Breast cancer Neg Hx    Social History   Occupational History   Not on file  Tobacco Use   Smoking status: Never   Smokeless tobacco: Never  Substance and Sexual Activity   Alcohol use: Not Currently    Comment: Occasionally   Drug use: Never   Sexual activity: Not Currently    Birth control/protection: Abstinence    Tobacco Counseling Counseling given: Not Answered   Immunizations and Health Maintenance Immunization History  Administered Date(s) Administered   Janssen (J&J) SARS-COV-2 Vaccination 02/16/2020   Tdap 10/03/2015   Zoster Recombinant(Shingrix ) 09/29/2021, 01/05/2022   Health Maintenance Due  Topic Date Due   Pneumococcal Vaccine: 50+ Years (1 of 2 - PCV) Never done   OPHTHALMOLOGY EXAM  03/07/2024   Diabetic kidney evaluation - Urine ACR  03/24/2024  Activities of Daily Living    04/27/2024    8:51 AM 04/25/2024    7:39 AM  In your present state of health, do you have any difficulty performing the following activities:  Hearing? 0 0  Vision? 0 0  Difficulty concentrating or making decisions? 0 0  Walking or climbing stairs? 0 0  Dressing or bathing? 0 0  Doing errands, shopping? 0 0  Preparing Food and eating ? N N  Using the Toilet? N N  In the past six months, have you accidently leaked urine? Y N  Comment due to long hard violet coughs   Do you have problems with loss of bowel control? Y N  Comment one time   Managing your Medications? N N  Managing your Finances? N N  Housekeeping or managing your Housekeeping? N N    Physical Exam   Physical Exam Constitutional:      Appearance: Normal appearance.  HENT:     Head: Normocephalic and atraumatic.     Mouth/Throat:     Pharynx: Oropharynx is  clear. No oropharyngeal exudate.   Cardiovascular:     Rate and Rhythm: Normal rate.     Heart sounds: Normal heart sounds.  Pulmonary:     Effort: No respiratory distress.     Breath sounds: Normal breath sounds. No wheezing or rales.  Abdominal:     General: There is no distension.     Palpations: Abdomen is soft.     Tenderness: There is no abdominal tenderness.  Lymphadenopathy:     Cervical: No cervical adenopathy.   Neurological:     General: No focal deficit present.     Mental Status: She is alert.   Psychiatric:        Mood and Affect: Mood normal.        Behavior: Behavior normal.    (optional), or other factors deemed appropriate based on the beneficiary's medical and social history and current clinical standards.   Advanced Directives: Does Patient Have a Medical Advance Directive?: No  EKG:  normal EKG, normal sinus rhythm, unchanged from previous tracings      Assessment:    This is a routine wellness examination for this patient .  Patient feels well.  She is recently recovering from a cough and states that this is slowly improving.  Vision/Hearing screen Vision Screening   Right eye Left eye Both eyes  Without correction 20/25 20/25 20/25   With correction        Goals   None     Depression Screen    04/27/2024    9:10 AM 04/21/2024    9:10 AM 01/10/2024    8:16 AM 11/25/2023    8:10 AM  PHQ 2/9 Scores  PHQ - 2 Score 0 0 0 0  PHQ- 9 Score 0 0 0 0     Fall Risk    04/27/2024    9:07 AM  Fall Risk   Falls in the past year? 0  Number falls in past yr: 0  Injury with Fall? 0  Risk for fall due to : No Fall Risks  Follow up Falls evaluation completed    Cognitive Function:        04/27/2024    9:11 AM  6CIT Screen  What Year? 0 points  What month? 0 points  What time? 0 points  Count back from 20 0 points  Months in reverse 0 points  Repeat phrase 0 points  Total Score 0 points  Patient Care Team: Bair, Kalpana, MD as  PCP - General (Family Medicine) [facility], [facility] (Optometry)     Plan:   Will give pneumonia vaccine today  I have personally reviewed and noted the following in the patient's chart:   Medical and social history Use of alcohol, tobacco or illicit drugs  Current medications and supplements including opioid prescriptions. Patient is not currently taking opioid prescriptions. Functional ability and status Nutritional status Physical activity Advanced directives List of other physicians Hospitalizations, surgeries, and ER visits in previous 12 months Vitals Screenings to include cognitive, depression, and falls Referrals and appointments  In addition, I have reviewed and discussed with patient certain preventive protocols, quality metrics, and best practice recommendations. A written personalized care plan for preventive services as well as general preventive health recommendations were provided to patient.   Addendum: -Patient initially consented to the pneumonia vaccine but then decided that she did not want to have this done today so she did not receive it    Shellee Devonshire, [facility] 04/27/2024

## 2024-04-27 NOTE — Addendum Note (Signed)
 Addended by: Thressa Flora D on: 04/27/2024 10:03 AM   Modules accepted: Orders

## 2024-04-27 NOTE — Patient Instructions (Signed)

## 2024-04-27 NOTE — Addendum Note (Signed)
 Addended by: Gradie Lawless A on: 04/27/2024 09:51 AM   Modules accepted: Orders

## 2024-04-27 NOTE — Progress Notes (Unsigned)
 Name:Nina Bryant MRN: [id number] DOB: 1960-11-22   CHIEF COMPLAINT:  CPAP F/U   HISTORY OF PRESENT ILLNESS:  Nina Bryant is a 64 y.o. w/ a h/o OSA, HTN, GERD and obesity who presents for CPAP follow up visit. Reports that she was initially diagnosed with severe OSA in 2017 and subsequently started on CPAP therapy. Reports using CPAP therapy consistently over the years, which is confirmed by compliance data. She is currently using the Airfit P10 nasal pillow mask, which is comfortable. Denies air leaks or nasal congestion. Reports not feeling as refreshed upon awakening with CPAP therapy.    EPWORTH SLEEP SCORE    10/02/2021    3:00 PM  Results of the Epworth flowsheet  Sitting and reading 3  Watching TV 2  Sitting, inactive in a public place (e.g. a theatre or a meeting) 1  As a passenger in a car for an hour without a break 2  Lying down to rest in the afternoon when circumstances permit 2  Sitting and talking to someone 0  Sitting quietly after a lunch without alcohol 1  In a car, while stopped for a few minutes in traffic 0  Total score 11    PAST MEDICAL HISTORY :   has a past medical history of Annual physical exam (03/11/2020), Anxiety and depression (03/11/2020), Arthritis, BMI 32.0-32.9,adult (03/20/2022), Cataract, Cervical arthritis (03/15/2021), COVID-19, Diabetes mellitus without complication (HCC), GERD (gastroesophageal reflux disease), History of migraine (07/18/2022), Hypertension, Menopause (03/11/2020), Migraines, OSA on CPAP, Prediabetes, Prediabetes (02/04/2019), Right shoulder pain (09/30/2023), Sleep apnea, Sneezing (02/04/2019), and UTI (urinary tract infection).  has a past surgical history that includes Abdominal hysterectomy and Abdominoplasty. Prior to Admission medications   Medication Sig Start Date End Date Taking? Authorizing Provider  Barberry-Oreg Grape-Goldenseal (BERBERINE COMPLEX PO) Take by mouth.   Yes [provider]   benzonatate  (TESSALON ) 200 MG capsule Take 1 capsule (200 mg total) by mouth 3 (three) times daily as needed for cough. 04/21/24  Yes Bair, Kalpana, MD  fluticasone  (FLONASE ) 50 MCG/ACT nasal spray Place 1 spray into both nostrils daily. 04/22/24  Yes Bair, Kalpana, MD  nebivolol  (BYSTOLIC ) 5 MG tablet Take 1 tablet (5 mg total) by mouth daily. 09/25/23  Yes Valli Gaw, MD  pantoprazole  (PROTONIX ) 20 MG tablet Take 1 tablet (20 mg total) by mouth daily. 04/21/24  Yes Bair, Kalpana, MD   Allergies  Allergen Reactions   Hydrochlorothiazide      AKI    FAMILY HISTORY:  family history includes COPD in her brother; Cancer in her maternal grandmother; Diabetes in her father and mother; Heart disease in her mother; Other in her brother and sister; Seizures in her father; Stroke in her father. SOCIAL HISTORY:  reports that she has never smoked. She has never used smokeless tobacco. She reports that she does not currently use alcohol. She reports that she does not use drugs.   Review of Systems:  Gen:  Denies  fever, sweats, chills weight loss  HEENT: Denies blurred vision, double vision, ear pain, eye pain, hearing loss, nose bleeds, sore throat Cardiac:  No dizziness, chest pain or heaviness, chest tightness,edema, No JVD Resp:   No cough, -sputum production, -shortness of breath,-wheezing, -hemoptysis,  Gi: Denies swallowing difficulty, stomach pain, nausea or vomiting, diarrhea, constipation, bowel incontinence Gu:  Denies bladder incontinence, burning urine Ext:   Denies Joint pain, stiffness or swelling Skin: Denies  skin rash, easy bruising or bleeding or hives Endoc:  Denies polyuria,  polydipsia , polyphagia or weight change Psych:   Denies depression, insomnia or hallucinations  Other:  All other systems negative  VITAL SIGNS: BP 120/88 (BP Location: Left Arm, Patient Position: Sitting, Cuff Size: Large)   Pulse 61   Temp 98.3 F (36.8 C) (Oral)   Ht 5' 6 (1.676 m)   Wt 204 lb  9.6 oz (92.8 kg)   SpO2 98%   BMI 33.02 kg/m    Physical Examination:   General Appearance: No distress  EYES PERRLA, EOM intact.   NECK Supple, No JVD Pulmonary: normal breath sounds, No wheezing.  CardiovascularNormal S1,S2.  No m/r/g.   Abdomen: Benign, Soft, non-tender. Skin:   warm, no rashes, no ecchymosis  Extremities: normal, no cyanosis, clubbing. Neuro:without focal findings,  speech normal  PSYCHIATRIC: Mood, affect within normal limits.   ASSESSMENT AND PLAN  OSA Patient is using and benefiting from CPAP therapy. Counseled patient on increasing total sleep time to 7-8 hours per night. Discussed the consequences of untreated sleep apnea. Advised not to drive drowsy for safety of patient and others. Will follow up in 3 months.     HTN BP borderline elevated, advised patient to follow up with PCP for further management.  Obesity Counseled patient on diet and lifestyle modification. Will also try patient on Zepbound and titrate as tolerated. Patient denies any family or personal history of thyroid  medullary CA or pancreatitis. Will follow up in 3 months.    Patient  satisfied with Plan of action and management. All questions answered  I spent a total of 32 minutes reviewing chart data, face-to-face evaluation with the patient, counseling and coordination of care as detailed above.    Bethene Hankinson, M.D.  Sleep Medicine [facility]

## 2024-04-27 NOTE — Patient Instructions (Addendum)
  Ms. Nesbitt , Thank you for taking time to come for your Medicare Wellness Visit. I appreciate your ongoing commitment to your health goals. Please review the following plan we discussed and let me know if I can assist you in the future.   These are the goals we discussed:  Goals   None     This is a list of the screening recommended for you and due dates:  Health Maintenance  Topic Date Due   Pneumococcal Vaccine for age over 70 (1 of 2 - PCV) Never done   Eye exam for diabetics  03/07/2024   Yearly kidney health urinalysis for diabetes  03/24/2024   COVID-19 Vaccine (2 - 2024-25 season) 05/10/2024*   Pap with HPV screening  09/29/2024*   Hemoglobin A1C  05/24/2024   Flu Shot  06/12/2024   Colon Cancer Screening  10/05/2024   Mammogram  11/18/2024   Yearly kidney function blood test for diabetes  11/24/2024   Complete foot exam   04/21/2025   Medicare Annual Wellness Visit  04/27/2025   DTaP/Tdap/Td vaccine (2 - Td or Tdap) 10/02/2025   DEXA scan (bone density measurement)  Completed   Hepatitis C Screening  Completed   HIV Screening  Completed   Zoster (Shingles) Vaccine  Completed   HPV Vaccine  Aged Out   Meningitis B Vaccine  Aged Out  *Topic was postponed. The date shown is not the original due date.     We will give you a pneumonia vaccine today

## 2024-04-28 ENCOUNTER — Telehealth: Payer: Self-pay

## 2024-04-28 ENCOUNTER — Ambulatory Visit: Payer: Self-pay

## 2024-04-28 DIAGNOSIS — R3915 Urgency of urination: Secondary | ICD-10-CM

## 2024-04-28 DIAGNOSIS — E782 Mixed hyperlipidemia: Secondary | ICD-10-CM

## 2024-04-28 LAB — LIPID PANEL
Chol/HDL Ratio: 4.3 ratio (ref 0.0–4.4)
Cholesterol, Total: 199 mg/dL (ref 100–199)
HDL: 46 mg/dL (ref >39)
LDL Chol Calc (NIH): 127 mg/dL — ABNORMAL HIGH (ref 0–99)
Triglycerides: 145 mg/dL (ref 0–149)
VLDL Cholesterol Cal: 26 mg/dL (ref 5–40)

## 2024-04-28 LAB — HEMOGLOBIN A1C
Est. average glucose Bld gHb Est-mCnc: 140 mg/dL
Hgb A1c MFr Bld: 6.5 % — ABNORMAL HIGH (ref 4.8–5.6)

## 2024-04-28 LAB — COMPREHENSIVE METABOLIC PANEL WITH GFR
ALT: 14 IU/L (ref 0–32)
AST: 14 IU/L (ref 0–40)
Albumin: 4.8 g/dL (ref 3.9–4.9)
Alkaline Phosphatase: 91 IU/L (ref 44–121)
BUN/Creatinine Ratio: 12 (ref 12–28)
BUN: 11 mg/dL (ref 8–27)
Bilirubin Total: 0.4 mg/dL (ref 0.0–1.2)
CO2: 22 mmol/L (ref 20–29)
Calcium: 9.8 mg/dL (ref 8.7–10.3)
Chloride: 103 mmol/L (ref 96–106)
Creatinine, Ser: 0.92 mg/dL (ref 0.57–1.00)
Globulin, Total: 2.4 g/dL (ref 1.5–4.5)
Glucose: 104 mg/dL — ABNORMAL HIGH (ref 70–99)
Potassium: 4.6 mmol/L (ref 3.5–5.2)
Sodium: 139 mmol/L (ref 134–144)
Total Protein: 7.2 g/dL (ref 6.0–8.5)
eGFR: 69 mL/min/{1.73_m2} (ref >59)

## 2024-04-28 LAB — MICROALBUMIN / CREATININE URINE RATIO
Creatinine, Urine: 84.4 mg/dL
Microalb/Creat Ratio: 6 mg/g{creat} (ref 0–29)
Microalbumin, Urine: 4.8 ug/mL

## 2024-04-28 LAB — URINALYSIS, ROUTINE W REFLEX MICROSCOPIC
Bilirubin, UA: NEGATIVE
Glucose, UA: NEGATIVE
Ketones, UA: NEGATIVE
Nitrite, UA: NEGATIVE
Protein,UA: NEGATIVE
Specific Gravity, UA: 1.012 (ref 1.005–1.030)
Urobilinogen, Ur: 0.2 mg/dL (ref 0.2–1.0)
pH, UA: 6 (ref 5.0–7.5)

## 2024-04-28 LAB — MICROSCOPIC EXAMINATION
Bacteria, UA: NONE SEEN
Casts: NONE SEEN /LPF
RBC, Urine: NONE SEEN /HPF (ref 0–2)

## 2024-04-28 MED ORDER — ROSUVASTATIN CALCIUM 10 MG PO TABS: 10.0000 mg | ORAL_TABLET | Freq: Every day | ORAL | 0 refills | Status: DC

## 2024-04-28 NOTE — Telephone Encounter (Signed)
 Copied from CRM [id number]. Topic: General - Other >> Apr 28, 2024  2:42 PM [place] S wrote: Reason for CRM: the authorization has been approved by insurance and they Will fax over approval letter with effective date.

## 2024-04-28 NOTE — Progress Notes (Signed)
 Please let Roni know I reviewed her lab results and have the following recommendations:  - HbA1c is slightly improved compared to 5 months ago.  - Lipid panel shows elevated LDL or bad cholesterol compared to last lab. Her 10-year risk of having adverse side effects from this like heart attack, stroke is 18.5%. To reduce this risk recommend starting cholesterol lowering medication called Rosuvastatin or Crestor 10 mg. Also recommend eating more plant based food, cutting down on carbohydrate, fatty products. If she is willing I will go ahead and send medication to the pharmacy. I also see that she has started Zepbound, I anticipate improvement in some of these labs with weight loss as well.  - Stable kidney and liver function. I am waiting on the urine results. Will update her once that is back.   Thank you,  Jacklin Mascot, MD

## 2024-04-28 NOTE — Telephone Encounter (Signed)
 Approval letter attached to patients chart

## 2024-04-28 NOTE — Telephone Encounter (Signed)
*  Pulm  Pharmacy Patient Advocate Encounter   Received notification from CoverMyMeds that prior authorization for Zepbound 2.5MG /0.5ML pen-injectors  is required/requested.   Insurance verification completed.   The patient is insured through [facility] .   Per test claim: PA required; PA submitted to above mentioned insurance via CoverMyMeds Key/confirmation #/EOC [facility] Status is pending

## 2024-04-29 ENCOUNTER — Encounter: Payer: Self-pay | Admitting: Sleep Medicine

## 2024-05-13 ENCOUNTER — Other Ambulatory Visit: Payer: Self-pay

## 2024-05-13 DIAGNOSIS — R051 Acute cough: Secondary | ICD-10-CM

## 2024-05-27 NOTE — Congregational Nurse Program (Signed)
  Dept: [phone]   Congregational Nurse Program Note  Date of Encounter: 05/27/2024  Clinic visit to check blood pressure, BP 142/86, discussed normal blood pressure levels and weight management to help maintain blood pressure.  Reviewed complications of BP medications and symptoms to report to PCP.  Past Medical History: Past Medical History:  Diagnosis Date   Annual physical exam 03/11/2020   Anxiety and depression 03/11/2020   ((Pt Qnr Sub: from Divorce))    Arthritis    left shoulder   BMI 32.0-32.9,adult 03/20/2022   Cataract    ? which eye mild    Cervical arthritis 03/15/2021   03/15/21      COVID-19    11/04/21 sneezing   Diabetes mellitus without complication (HCC)    GERD (gastroesophageal reflux disease)    Only when diet changes   History of migraine 07/18/2022   Hypertension    Menopause 03/11/2020   Migraines    OSA on CPAP    Prediabetes    Prediabetes 02/04/2019   Right shoulder pain 09/30/2023   Sleep apnea    Sneezing 02/04/2019   UTI (urinary tract infection)     Encounter Details:  Community Questionnaire - 05/27/24 1418       Questionnaire   Ask client: Do you give verbal consent for me to treat you today? Yes    Student Assistance N/A    Location Patient Served  [facility]    Encounter Setting CN site    Population Status Unknown   Lives in own home   Insurance [place] or [place]    Insurance/Financial Assistance Referral N/A    Medication N/A    Medical Provider Yes    Screening Referrals Made N/A    Medical Referrals Made N/A    Medical Appointment Completed [facility] P[facility]inic    CNP Interventions Advocate/Support;Educate;Counsel    Screenings CN Performed Blood Pressure    ED Visit Averted N/A    Life-Saving Intervention Made N/A

## 2024-06-16 ENCOUNTER — Encounter: Admitting: Internal Medicine

## 2024-06-18 ENCOUNTER — Encounter: Admitting: Internal Medicine

## 2024-06-18 LAB — GLUCOSE, POCT (MANUAL RESULT ENTRY): POC Glucose: 89 mg/dL (ref 70–99)

## 2024-06-19 ENCOUNTER — Encounter: Payer: Self-pay | Admitting: Sleep Medicine

## 2024-06-24 NOTE — Congregational Nurse Program (Signed)
  Dept: [phone]   Congregational Nurse Program Note  Date of Encounter: 06/18/2024  Visit to clinic to check blood pressure, BP 140/80, pulse 67, O2 Sat 98%.  Educated regarding strategies to manage weight and blood pressure when traveling or attending a lot of meetings.  Past Medical History: Past Medical History:  Diagnosis Date   Annual physical exam 03/11/2020   Anxiety and depression 03/11/2020   ((Pt Qnr Sub: from Divorce))    Arthritis    left shoulder   BMI 32.0-32.9,adult 03/20/2022   Cataract    ? which eye mild    Cervical arthritis 03/15/2021   03/15/21      COVID-19    11/04/21 sneezing   Diabetes mellitus without complication (HCC)    GERD (gastroesophageal reflux disease)    Only when diet changes   History of migraine 07/18/2022   Hypertension    Menopause 03/11/2020   Migraines    OSA on CPAP    Prediabetes    Prediabetes 02/04/2019   Right shoulder pain 09/30/2023   Sleep apnea    Sneezing 02/04/2019   UTI (urinary tract infection)     Encounter Details:  Community Questionnaire - 06/18/24 1722       Questionnaire   Ask client: Do you give verbal consent for me to treat you today? Yes    Student Assistance N/A    Location Patient Served  N/A    Encounter Setting CN site    Population Status Unknown   Lives in own home   Insurance Private or [place] Insurance    Insurance/Financial Assistance Referral N/A    Medication Patient Medications Reviewed    Medical Provider Yes    Screening Referrals Made N/A    Medical Referrals Made N/A    Medical Appointment Completed N/A    CNP Interventions Advocate/Support;Educate;Counsel    Screenings CN Performed Blood Pressure;Blood Glucose    ED Visit Averted N/A    Life-Saving Intervention Made N/A

## 2024-06-24 NOTE — Congregational Nurse Program (Signed)
  Dept: [phone]   Congregational Nurse Program Note  Date of Encounter: 06/24/2024  Returned call to housing authority staff person to discuss food diary entries.  Is getting fast food breakfast 3 to 4 times each week.  Discussed how to make healthier choices for breakfast and ways to increase intake of fruits, vegetables and nuts for weight loss.. Past Medical History: Past Medical History:  Diagnosis Date   Annual physical exam 03/11/2020   Anxiety and depression 03/11/2020   ((Pt Qnr Sub: from Divorce))    Arthritis    left shoulder   BMI 32.0-32.9,adult 03/20/2022   Cataract    ? which eye mild    Cervical arthritis 03/15/2021   03/15/21      COVID-19    11/04/21 sneezing   Diabetes mellitus without complication (HCC)    GERD (gastroesophageal reflux disease)    Only when diet changes   History of migraine 07/18/2022   Hypertension    Menopause 03/11/2020   Migraines    OSA on CPAP    Prediabetes    Prediabetes 02/04/2019   Right shoulder pain 09/30/2023   Sleep apnea    Sneezing 02/04/2019   UTI (urinary tract infection)     Encounter Details:  Community Questionnaire - 06/24/24 1645       Questionnaire   Ask client: Do you give verbal consent for me to treat you today? Yes    Student Assistance N/A    Location Patient Served  [facility]    Encounter Setting CN site    Population Status Unknown   Lives in own home   Insurance [place] or [place]    Insurance/Financial Assistance Referral N/A    Medication Patient Medications Reviewed    Medical Provider Yes    Screening Referrals Made N/A    Medical Referrals Made N/A    Medical Appointment Completed N/A    CNP Interventions Advocate/Support;Educate;Counsel    Screenings CN Performed N/A    ED Visit Averted N/A    Life-Saving Intervention Made N/A

## 2024-07-26 ENCOUNTER — Other Ambulatory Visit: Payer: Self-pay

## 2024-07-26 DIAGNOSIS — E782 Mixed hyperlipidemia: Secondary | ICD-10-CM

## 2024-07-28 ENCOUNTER — Encounter: Payer: Self-pay | Admitting: Sleep Medicine

## 2024-07-28 ENCOUNTER — Ambulatory Visit: Admitting: Sleep Medicine

## 2024-07-28 VITALS — BP 144/80 | HR 74 | Temp 97.1°F | Ht 66.0 in | Wt 211.4 lb

## 2024-07-28 DIAGNOSIS — E119 Type 2 diabetes mellitus without complications: Secondary | ICD-10-CM

## 2024-07-28 DIAGNOSIS — I10 Essential (primary) hypertension: Secondary | ICD-10-CM

## 2024-07-28 DIAGNOSIS — G4733 Obstructive sleep apnea (adult) (pediatric): Secondary | ICD-10-CM | POA: Diagnosis not present

## 2024-07-28 DIAGNOSIS — Z9989 Dependence on other enabling machines and devices: Secondary | ICD-10-CM

## 2024-07-28 DIAGNOSIS — Z7985 Long-term (current) use of injectable non-insulin antidiabetic drugs: Secondary | ICD-10-CM

## 2024-07-28 NOTE — Progress Notes (Signed)
 The patient attended a screening event on 04/11/2024, where her blood pressure was measured at 160/92 mmHg after a second check, the patients non-fasting glucose was 110 mg/dl. During the event, the patient reported that she does not smoke, she did not indicate any insurance, she is established with a primary care provider (Dr. Hope), and she declined the SDOH screener.   A chart review indicates Dr. Luke Shade - [facility] as her PCP. The pt was last seen by this provider on 04/21/2024, she had an upcoming appt with her provider on 10/12/2024 but it has been cancelled. There are no [facility] visible upcoming appts with her PCP at this itme. The pt's PCP is currently monitoring her BP. The patient was last seen by Dr. Glenys Hope on 11/25/2023 for an office visit. Chart review further indicates that the patient has Medicare and BCBS for her insurance. No SDOH needs were indicated at this time.   [facility] sent courtesy letter with bp education and healthy cooking class flyer in case needed by pt.   At this time, no additional support from the Health Equity Team is indicated.

## 2024-07-28 NOTE — Progress Notes (Signed)
 Name:Nina Bryant MRN: [id number] DOB: 04-11-1961   CHIEF COMPLAINT:  CPAP F/U   HISTORY OF PRESENT ILLNESS: Nina Bryant is a 63 y.o. w/ a h/o OSA, HTN, GERD and obesity who presents for CPAP follow up visit. Reports using CPAP therapy every night, which is confirmed by compliance data. She is currently using the Airfit P10 nasal pillow, which causes some nasal irritation. Reports not feeling as refreshed upon awakening with CPAP therapy.    EPWORTH SLEEP SCORE    10/02/2021    3:00 PM  Results of the Epworth flowsheet  Sitting and reading 3  Watching TV 2  Sitting, inactive in a public place (e.g. a theatre or a meeting) 1  As a passenger in a car for an hour without a break 2  Lying down to rest in the afternoon when circumstances permit 2  Sitting and talking to someone 0  Sitting quietly after a lunch without alcohol 1  In a car, while stopped for a few minutes in traffic 0  Total score 11    PAST MEDICAL HISTORY :   has a past medical history of Annual physical exam (03/11/2020), Anxiety and depression (03/11/2020), Arthritis, BMI 32.0-32.9,adult (03/20/2022), Cataract, Cervical arthritis (03/15/2021), COVID-19, Diabetes mellitus without complication (HCC), GERD (gastroesophageal reflux disease), History of migraine (07/18/2022), Hypertension, Menopause (03/11/2020), Migraines, OSA on CPAP, Prediabetes, Prediabetes (02/04/2019), Right shoulder pain (09/30/2023), Sleep apnea, Sneezing (02/04/2019), and UTI (urinary tract infection).  has a past surgical history that includes Abdominal hysterectomy and Abdominoplasty. Prior to Admission medications   Medication Sig Start Date End Date Taking? Authorizing Provider  Barberry-Oreg Grape-Goldenseal (BERBERINE COMPLEX PO) Take by mouth.    [provider]  benzonatate  (TESSALON ) 200 MG capsule Take 1 capsule (200 mg total) by mouth 3 (three) times daily as needed for cough. 04/21/24   Bair, Kalpana, MD   fluticasone  (FLONASE ) 50 MCG/ACT nasal spray Place 1 spray into both nostrils daily. 04/22/24   Bair, Kalpana, MD  nebivolol  (BYSTOLIC ) 5 MG tablet Take 1 tablet (5 mg total) by mouth daily. 09/25/23   Hope Merle, MD  pantoprazole  (PROTONIX ) 20 MG tablet TAKE 1 TABLET BY MOUTH EVERY DAY 05/13/24   Bair, Kalpana, MD  rosuvastatin  (CRESTOR ) 10 MG tablet TAKE 1 TABLET BY MOUTH EVERY DAY 07/27/24   Bair, Kalpana, MD  tirzepatide  (ZEPBOUND ) 2.5 MG/0.5ML Pen Inject 2.5 mg into the skin once a week. 04/27/24   Lothar Prehn D, MD   Allergies  Allergen Reactions   Hydrochlorothiazide      AKI    FAMILY HISTORY:  family history includes COPD in her brother; Cancer in her maternal grandmother; Diabetes in her father and mother; Heart disease in her mother; Other in her brother and sister; Seizures in her father; Stroke in her father. SOCIAL HISTORY:  reports that she has never smoked. She has never used smokeless tobacco. She reports that she does not currently use alcohol. She reports that she does not use drugs.   Review of Systems:  Gen:  Denies  fever, sweats, chills weight loss  HEENT: Denies blurred vision, double vision, ear pain, eye pain, hearing loss, nose bleeds, sore throat Cardiac:  No dizziness, chest pain or heaviness, chest tightness,edema, No JVD Resp:   No cough, -sputum production, -shortness of breath,-wheezing, -hemoptysis,  Gi: Denies swallowing difficulty, stomach pain, nausea or vomiting, diarrhea, constipation, bowel incontinence Gu:  Denies bladder incontinence, burning urine Ext:   Denies Joint pain, stiffness or swelling Skin:  Denies  skin rash, easy bruising or bleeding or hives Endoc:  Denies polyuria, polydipsia , polyphagia or weight change Psych:   Denies depression, insomnia or hallucinations  Other:  All other systems negative  VITAL SIGNS: BP (!) 144/80   Pulse 74   Temp (!) 97.1 F (36.2 C)   Ht 5' 6 (1.676 m)   Wt 211 lb 6.4 oz (95.9 kg)   SpO2 97%    BMI 34.12 kg/m     Physical Examination:   General Appearance: No distress  EYES PERRLA, EOM intact.   NECK Supple, No JVD Pulmonary: normal breath sounds, No wheezing.  CardiovascularNormal S1,S2.  No m/r/g.   Abdomen: Benign, Soft, non-tender. Skin:   warm, no rashes, no ecchymosis  Extremities: normal, no cyanosis, clubbing. Neuro:without focal findings,  speech normal  PSYCHIATRIC: Mood, affect within normal limits.   ASSESSMENT AND PLAN  OSA Patient is using and benefiting from CPAP therapy. Discussed the consequences of untreated sleep apnea. Advised not to drive drowsy for safety of patient and others. Will follow up in 1 year.    HTN Stable, on current management. Following with PCP.    Patient  satisfied with Plan of action and management. All questions answered  I spent a total of 22 minutes reviewing chart data, face-to-face evaluation with the patient, counseling and coordination of care as detailed above.    Garland Smouse, M.D.  Sleep Medicine [facility]

## 2024-10-12 ENCOUNTER — Encounter

## 2024-10-19 NOTE — Telephone Encounter (Signed)
 Recommend office visit to discuss concerns and update labs including A1c.   Thank you,  Luke Shade, MD

## 2024-10-23 ENCOUNTER — Encounter: Payer: Self-pay | Admitting: Gastroenterology

## 2024-10-23 ENCOUNTER — Ambulatory Visit
Admission: RE | Admit: 2024-10-23 | Discharge: 2024-10-23 | Disposition: A | Discharge status: Home / Self Care | Attending: Gastroenterology | Admitting: Gastroenterology

## 2024-10-23 ENCOUNTER — Ambulatory Visit: Admitting: Anesthesiology

## 2024-10-23 ENCOUNTER — Other Ambulatory Visit: Payer: Self-pay

## 2024-10-23 ENCOUNTER — Encounter: Admission: RE | Disposition: A | Payer: Self-pay | Attending: Gastroenterology

## 2024-10-23 DIAGNOSIS — D123 Benign neoplasm of transverse colon: Secondary | ICD-10-CM | POA: Diagnosis not present

## 2024-10-23 DIAGNOSIS — Z833 Family history of diabetes mellitus: Secondary | ICD-10-CM | POA: Diagnosis not present

## 2024-10-23 DIAGNOSIS — K219 Gastro-esophageal reflux disease without esophagitis: Secondary | ICD-10-CM | POA: Diagnosis not present

## 2024-10-23 DIAGNOSIS — K5730 Diverticulosis of large intestine without perforation or abscess without bleeding: Secondary | ICD-10-CM | POA: Diagnosis not present

## 2024-10-23 DIAGNOSIS — Z8249 Family history of ischemic heart disease and other diseases of the circulatory system: Secondary | ICD-10-CM | POA: Diagnosis not present

## 2024-10-23 DIAGNOSIS — Z1211 Encounter for screening for malignant neoplasm of colon: Secondary | ICD-10-CM | POA: Diagnosis present

## 2024-10-23 DIAGNOSIS — D120 Benign neoplasm of cecum: Secondary | ICD-10-CM | POA: Diagnosis not present

## 2024-10-23 DIAGNOSIS — Z79899 Other long term (current) drug therapy: Secondary | ICD-10-CM | POA: Diagnosis not present

## 2024-10-23 DIAGNOSIS — K640 First degree hemorrhoids: Secondary | ICD-10-CM | POA: Diagnosis not present

## 2024-10-23 DIAGNOSIS — R7303 Prediabetes: Secondary | ICD-10-CM | POA: Diagnosis not present

## 2024-10-23 DIAGNOSIS — G4733 Obstructive sleep apnea (adult) (pediatric): Secondary | ICD-10-CM | POA: Diagnosis not present

## 2024-10-23 DIAGNOSIS — I10 Essential (primary) hypertension: Secondary | ICD-10-CM | POA: Diagnosis not present

## 2024-10-23 HISTORY — PX: COLONOSCOPY: SHX5424

## 2024-10-23 HISTORY — PX: POLYPECTOMY: SHX149

## 2024-10-23 SURGERY — COLONOSCOPY
Anesthesia: General

## 2024-10-23 MED ORDER — LIDOCAINE HCL (PF) 2 % IJ SOLN
INTRAMUSCULAR | Status: AC
  Filled 2024-10-23: qty 5

## 2024-10-23 MED ORDER — SODIUM CHLORIDE (PF) 0.9 % IJ SOLN
INTRAMUSCULAR | Status: DC | PRN
  Administered 2024-10-23: 7 mL

## 2024-10-23 MED ORDER — LIDOCAINE 2% (20 MG/ML) 5 ML SYRINGE
INTRAMUSCULAR | Status: DC | PRN
  Administered 2024-10-23: 20 mg via INTRAVENOUS

## 2024-10-23 MED ORDER — SODIUM CHLORIDE 0.9 % IV SOLN: INTRAVENOUS | Status: DC

## 2024-10-23 MED ORDER — PROPOFOL 500 MG/50ML IV EMUL
INTRAVENOUS | Status: DC | PRN
  Administered 2024-10-23: 120 ug/kg/min via INTRAVENOUS

## 2024-10-23 MED ORDER — PROPOFOL 1000 MG/100ML IV EMUL
INTRAVENOUS | Status: AC
  Filled 2024-10-23: qty 200

## 2024-10-23 MED ORDER — PROPOFOL 10 MG/ML IV BOLUS
INTRAVENOUS | Status: DC | PRN
  Administered 2024-10-23: 70 mg via INTRAVENOUS
  Administered 2024-10-23: 30 mg via INTRAVENOUS

## 2024-10-23 MED ORDER — GLYCOPYRROLATE 0.2 MG/ML IJ SOLN
INTRAMUSCULAR | Status: AC
  Filled 2024-10-23: qty 1

## 2024-10-23 NOTE — Op Note (Signed)
 [facility] Gastroenterology Patient Name: Nina Bryant Procedure Date: 10/23/2024 7:00 AM MRN: [id number] Account #: [id number] Date of Birth: Jul 18, 1961 Admit Type: Outpatient Age: 63 Room: [facility] ENDO ROOM 2 Gender: Female Note Status: Finalized Instrument Name: Colon Scope [id number] Procedure:             Colonoscopy Indications:           Screening for colorectal malignant neoplasm Providers:             Ruel Kung MD, MD Referring MD:          Luke Shade (Referring MD) Medicines:             Monitored Anesthesia Care Complications:         No immediate complications. Procedure:             Pre-Anesthesia Assessment:                        - Prior to the procedure, a History and Physical was                         performed, and patient medications, allergies and                         sensitivities were reviewed. The patient's tolerance                         of previous anesthesia was reviewed.                        - The risks and benefits of the procedure and the                         sedation options and risks were discussed with the                         patient. All questions were answered and informed                         consent was obtained.                        - ASA Grade Assessment: II - A patient with mild                         systemic disease.                        After obtaining informed consent, the colonoscope was                         passed under direct vision. Throughout the procedure,                         the patient's blood pressure, pulse, and oxygen                         saturations were monitored continuously. The                         Colonoscope was introduced  through the anus and                         advanced to the the cecum, identified by the                         appendiceal orifice. The colonoscopy was performed                         with ease. The patient tolerated the procedure well.                          The quality of the bowel preparation was excellent.                         The ileocecal valve, appendiceal orifice, and rectum                         were photographed. Findings:      The perianal and digital rectal examinations were normal.      A 10 mm polyp was found in the cecum. The polyp was semi-sessile.       Preparations were made for mucosal resection. Demarcation of the lesion       was performed with high-definition white light to clearly identify the       boundaries of the lesion. Saline was injected to raise the lesion. Snare       mucosal resection was performed. Resection and retrieval were complete.       Resected tissue margins were examined and clear of polyp tissue.      A 6 mm polyp was found in the transverse colon. The polyp was sessile.       The polyp was removed with a cold snare. Resection and retrieval were       complete.      Multiple medium-mouthed diverticula were found in the sigmoid colon.      Non-bleeding internal hemorrhoids were found during retroflexion. The       hemorrhoids were large and Grade I (internal hemorrhoids that do not       prolapse).      The exam was otherwise without abnormality on direct and retroflexion       views. Impression:            - One 10 mm polyp in the cecum, removed with mucosal                         resection. Resected and retrieved.                        - One 6 mm polyp in the transverse colon, removed with                         a cold snare. Resected and retrieved.                        - Diverticulosis in the sigmoid colon.                        - Non-bleeding internal hemorrhoids.                        -  The examination was otherwise normal on direct and                         retroflexion views.                        - Mucosal resection was performed. Resection and                         retrieval were complete. Recommendation:        - Discharge patient to home (with  escort).                        - Resume previous diet.                        - Continue present medications.                        - Await pathology results.                        - Repeat colonoscopy in 3 years for surveillance. Procedure Code(s):     --- Professional ---                        [phone], Colonoscopy, flexible; with endoscopic mucosal                         resection                        [id number], 59, Colonoscopy, flexible; with removal of                         tumor(s), polyp(s), or other lesion(s) by snare                         technique Diagnosis Code(s):     --- Professional ---                        Z12.11, Encounter for screening for malignant neoplasm                         of colon                        D12.0, Benign neoplasm of cecum                        D12.3, Benign neoplasm of transverse colon (hepatic                         flexure or splenic flexure)                        K64.0, First degree hemorrhoids                        K57.30, Diverticulosis of large intestine without                         perforation  or abscess without bleeding CPT copyright 2022 American Medical Association. All rights reserved. The codes documented in this report are preliminary and upon coder review may  be revised to meet current compliance requirements. Ruel Kung, MD Ruel Kung MD, MD 10/23/2024 8:07:42 AM This report has been signed electronically. Number of Addenda: 0 Note Initiated On: 10/23/2024 7:00 AM Scope Withdrawal Time: 0 hours 8 minutes 51 seconds  Total Procedure Duration: 0 hours 12 minutes 3 seconds  Estimated Blood Loss:  Estimated blood loss: none.      [facility]

## 2024-10-23 NOTE — Transfer of Care (Signed)
 Immediate Anesthesia Transfer of Care Note  Patient: Nina Bryant  Procedure(s) Performed: COLONOSCOPY POLYPECTOMY, INTESTINE  Patient Location: PACU and Endoscopy Unit  Anesthesia Type:General  Level of Consciousness: drowsy  Airway & Oxygen Therapy: Patient Spontanous Breathing  Post-op Assessment: Report given to RN and Post -op Vital signs reviewed and stable  Post vital signs: Reviewed  Last Vitals:  Vitals Value Taken Time  BP 108/60 10/23/24 08:07  Temp 35.8 C 10/23/24 08:07  Pulse 68 10/23/24 08:14  Resp 15 10/23/24 08:14  SpO2 98 % 10/23/24 08:14  Vitals shown include unfiled device data.  Last Pain:  Vitals:   10/23/24 0807  TempSrc: Temporal  PainSc: Asleep         Complications: No notable events documented.

## 2024-10-23 NOTE — Anesthesia Preprocedure Evaluation (Signed)
 Anesthesia Evaluation  Patient identified by MRN, date of birth, ID band Patient awake    Reviewed: Allergy & Precautions, H&P , NPO status , Patient's Chart, lab work & pertinent test results, reviewed documented beta blocker date and time   History of Anesthesia Complications Negative for: history of anesthetic complications  Airway Mallampati: III  TM Distance: >3 FB Neck ROM: full    Dental  (+) Dental Advidsory Given, Teeth Intact Permanent bridge on top left and bottom right:   Pulmonary neg shortness of breath, sleep apnea and Continuous Positive Airway Pressure Ventilation , neg COPD, neg recent URI   Pulmonary exam normal breath sounds clear to auscultation       Cardiovascular Exercise Tolerance: Good hypertension, (-) angina (-) Past MI and (-) Cardiac Stents negative cardio ROS Normal cardiovascular exam(-) dysrhythmias (-) Valvular Problems/Murmurs Rhythm:regular Rate:Normal     Neuro/Psych  PSYCHIATRIC DISORDERS Anxiety Depression    negative neurological ROS     GI/Hepatic Neg liver ROS,GERD  Controlled,,  Endo/Other  diabetes (borderline), Well Controlled    Renal/GU negative Renal ROS  negative genitourinary   Musculoskeletal   Abdominal   Peds  Hematology negative hematology ROS (+)   Anesthesia Other Findings Past Medical History: 03/11/2020: Annual physical exam 03/11/2020: Anxiety and depression     Comment:  ((Pt Qnr Sub: from Divorce))  No date: Arthritis     Comment:  left shoulder 03/20/2022: BMI 32.0-32.9,adult No date: Cataract     Comment:  ? which eye mild  03/15/2021: Cervical arthritis     Comment:  03/15/21   No date: COVID-19     Comment:  11/04/21 sneezing No date: Diabetes mellitus without complication (HCC) No date: GERD (gastroesophageal reflux disease)     Comment:  Only when diet changes 07/18/2022: History of migraine No date: Hypertension 03/11/2020: Menopause No  date: Migraines No date: OSA on CPAP No date: Prediabetes 02/04/2019: Prediabetes 09/30/2023: Right shoulder pain No date: Sleep apnea 02/04/2019: Sneezing No date: UTI (urinary tract infection)   Reproductive/Obstetrics negative OB ROS                              Anesthesia Physical Anesthesia Plan  ASA: 2  Anesthesia Plan: General   Post-op Pain Management:    Induction: Intravenous  PONV Risk Score and Plan: 3 and Propofol infusion, TIVA and Treatment may vary due to age or medical condition  Airway Management Planned: Natural Airway and Nasal Cannula  Additional Equipment:   Intra-op Plan:   Post-operative Plan:   Informed Consent: I have reviewed the patients History and Physical, chart, labs and discussed the procedure including the risks, benefits and alternatives for the proposed anesthesia with the patient or authorized representative who has indicated his/her understanding and acceptance.     Dental Advisory Given  Plan Discussed with: Anesthesiologist, CRNA and Surgeon  Anesthesia Plan Comments:         Anesthesia Quick Evaluation

## 2024-10-23 NOTE — H&P (Signed)
 Ruel Kung , MD [address], [address], [place], [place], [address] Phone: [phone] Fax: [phone]  Primary Care Physician:  Abbey Bruckner, MD   Pre-Procedure History & Physical: HPI:  Nina Bryant is a 63 y.o. female is here for an colonoscopy.   Past Medical History:  Diagnosis Date   Annual physical exam 03/11/2020   Anxiety and depression 03/11/2020   ((Pt Qnr Sub: from Divorce))    Arthritis    left shoulder   BMI 32.0-32.9,adult 03/20/2022   Cataract    ? which eye mild    Cervical arthritis 03/15/2021   03/15/21      COVID-19    11/04/21 sneezing   Diabetes mellitus without complication (HCC)    GERD (gastroesophageal reflux disease)    Only when diet changes   History of migraine 07/18/2022   Hypertension    Menopause 03/11/2020   Migraines    OSA on CPAP    Prediabetes    Prediabetes 02/04/2019   Right shoulder pain 09/30/2023   Sleep apnea    Sneezing 02/04/2019   UTI (urinary tract infection)     Past Surgical History:  Procedure Laterality Date   ABDOMINAL HYSTERECTOMY     fibroids   ABDOMINOPLASTY      Prior to Admission medications  Medication Sig Start Date End Date Taking? Authorizing Provider  nebivolol  (BYSTOLIC ) 5 MG tablet Take 1 tablet (5 mg total) by mouth daily. 09/25/23  Yes Hope Merle, MD  pantoprazole  (PROTONIX ) 20 MG tablet TAKE 1 TABLET BY MOUTH EVERY DAY 05/13/24  Yes Bair, Kalpana, MD  rosuvastatin  (CRESTOR ) 10 MG tablet TAKE 1 TABLET BY MOUTH EVERY DAY 07/27/24  Yes Bair, Kalpana, MD  Barberry-Oreg Grape-Goldenseal (BERBERINE COMPLEX PO) Take by mouth.    [provider]  benzonatate  (TESSALON ) 200 MG capsule Take 1 capsule (200 mg total) by mouth 3 (three) times daily as needed for cough. 04/21/24   Bair, Kalpana, MD  fluticasone  (FLONASE ) 50 MCG/ACT nasal spray Place 1 spray into both  nostrils daily. 04/22/24   Bair, Kalpana, MD  tirzepatide  (ZEPBOUND ) 2.5 MG/0.5ML Pen Inject 2.5 mg into the skin once a week. Patient not taking: Reported on 07/28/2024 04/27/24   Reddy, Pallavi D, MD    Allergies as of 10/14/2024 - Review Complete 07/28/2024  Allergen Reaction Noted   Hydrochlorothiazide   10/02/2021    Family History  Problem Relation Age of Onset   Diabetes Father    Stroke Father    Seizures Father    Heart disease Mother    Diabetes Mother    Other Sister        in 22s legionarres    COPD Brother    Cancer Maternal Grandmother        pancreatitic    Other Brother        colon resected ?reason   Breast cancer Neg Hx     Social History   Socioeconomic History   Marital status: Divorced    Spouse name: Not on file   Number of children: Not on file   Years of education: Not on file   Highest education level: Bachelor's degree (e.g., BA, AB, BS)  Occupational History   Not on file  Tobacco Use   Smoking status: Never   Smokeless tobacco: Never  Vaping Use   Vaping status: Never Used  Substance and Sexual Activity   Alcohol use: Not Currently    Comment: Occasionally   Drug use: Never  Sexual activity: Not Currently    Birth control/protection: Abstinence  Other Topics Concern   Not on file  Social History Narrative   Separated as of 2 months 11/2018    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    2 kids daughters   -1 daughter lives in [place] with granddaughter born 04/06/22    Drinks occasionally, never smoker    No guns, wears seat belt    BA degree   Social Drivers of Health   Tobacco Use: Low Risk (10/23/2024)   Patient History    Smoking Tobacco Use: Never    Smokeless Tobacco Use: Never    Passive Exposure: Not on file  Financial Resource Strain: Low Risk (04/25/2024)   Overall Financial Resource Strain (CARDIA)    Difficulty of Paying Living Expenses: Not hard at all  Food Insecurity: No Food Insecurity (04/25/2024)   Epic    Worried  About Programme Researcher, Broadcasting/film/video in the Last Year: Never true    Ran Out of Food in the Last Year: Never true  Transportation Needs: No Transportation Needs (04/25/2024)   Epic    Lack of Transportation (Medical): No    Lack of Transportation (Non-Medical): No  Physical Activity: Insufficiently Active (04/25/2024)   Exercise Vital Sign    Days of Exercise per Week: 2 days    Minutes of Exercise per Session: 20 min  Stress: No Stress Concern Present (04/25/2024)   [employer] of Occupational Health - Occupational Stress Questionnaire    Feeling of Stress: Not at all  Social Connections: Moderately Integrated (04/25/2024)   Social Connection and Isolation Panel    Frequency of Communication with Friends and Family: More than three times a week    Frequency of Social Gatherings with Friends and Family: Once a week    Attends Religious Services: More than 4 times per year    Active Member of Clubs or Organizations: Yes    Attends Banker Meetings: More than 4 times per year    Marital Status: Divorced  Intimate Partner Violence: Patient Declined (04/11/2024)   Humiliation, Afraid, Rape, and Kick questionnaire    Fear of Current or Ex-Partner: Patient declined    Emotionally Abused: Patient declined    Physically Abused: Patient declined    Sexually Abused: Patient declined  Depression (PHQ2-9): Low Risk (04/27/2024)   Depression (PHQ2-9)    PHQ-2 Score: 0  Alcohol Screen: Low Risk (04/25/2024)   Alcohol Screen    Last Alcohol Screening Score (AUDIT): 1  Housing: Low Risk (04/25/2024)   Epic    Unable to Pay for Housing in the Last Year: No    Number of Times Moved in the Last Year: 0    Homeless in the Last Year: No  Utilities: Patient Declined (04/11/2024)   [facility] Utilities    Threatened with loss of utilities: Patient declined  Health Literacy: Not on file    Review of Systems: See HPI, otherwise negative ROS  Physical Exam: BP (!) 153/77   Pulse 63   Temp (!) 96.6 F  (35.9 C) (Temporal)   Resp 16   Ht 5' 5 (1.651 m)   Wt 93.4 kg   SpO2 98%   BMI 34.28 kg/m  General:   Alert,  pleasant and cooperative in NAD Head:  Normocephalic and atraumatic. Neck:  Supple; no masses or thyromegaly. Lungs:  Clear throughout to auscultation, normal respiratory effort.    Heart:  +S1, +S2, Regular rate and rhythm, No edema. Abdomen:  Soft, nontender and nondistended. Normal bowel sounds, without guarding, and without rebound.   Neurologic:  Alert and  oriented x4;  grossly normal neurologically.  Impression/Plan: Nina Bryant is here for an colonoscopy to be performed for Screening colonoscopy average risk   Risks, benefits, limitations, and alternatives regarding  colonoscopy have been reviewed with the patient.  Questions have been answered.  All parties agreeable.   Ruel Kung, MD  10/23/2024, 7:44 AM

## 2024-10-26 LAB — SURGICAL PATHOLOGY

## 2024-10-30 ENCOUNTER — Ambulatory Visit: Payer: Self-pay | Admitting: Gastroenterology

## 2024-10-31 NOTE — Anesthesia Postprocedure Evaluation (Signed)
"   Anesthesia Post Note  Patient: Nina Bryant  Procedure(s) Performed: COLONOSCOPY POLYPECTOMY, INTESTINE  Patient location during evaluation: Endoscopy Anesthesia Type: General Level of consciousness: awake and alert Pain management: pain level controlled Vital Signs Assessment: post-procedure vital signs reviewed and stable Respiratory status: spontaneous breathing, nonlabored ventilation, respiratory function stable and patient connected to nasal cannula oxygen Cardiovascular status: blood pressure returned to baseline and stable Postop Assessment: no apparent nausea or vomiting Anesthetic complications: no   No notable events documented.   Last Vitals:  Vitals:   10/23/24 0817 10/23/24 0827  BP: 118/73 133/82  Pulse: 69   Resp: 13 13  Temp:    SpO2: 100% 100%    Last Pain:  Vitals:   10/23/24 0817  TempSrc:   PainSc: 0-No pain                 Prentice Murphy      "

## 2024-11-10 ENCOUNTER — Other Ambulatory Visit: Payer: Self-pay

## 2024-11-10 DIAGNOSIS — Z1231 Encounter for screening mammogram for malignant neoplasm of breast: Secondary | ICD-10-CM

## 2024-11-11 ENCOUNTER — Other Ambulatory Visit: Payer: Self-pay

## 2024-11-11 DIAGNOSIS — E782 Mixed hyperlipidemia: Secondary | ICD-10-CM

## 2024-11-26 DIAGNOSIS — E1169 Type 2 diabetes mellitus with other specified complication: Secondary | ICD-10-CM

## 2024-11-26 DIAGNOSIS — E119 Type 2 diabetes mellitus without complications: Secondary | ICD-10-CM

## 2024-11-27 ENCOUNTER — Other Ambulatory Visit: Payer: Self-pay

## 2024-11-27 DIAGNOSIS — E1159 Type 2 diabetes mellitus with other circulatory complications: Secondary | ICD-10-CM

## 2024-11-27 MED ORDER — NEBIVOLOL HCL 5 MG PO TABS: 5.0000 mg | ORAL_TABLET | Freq: Every day | ORAL | 0 refills | Status: AC

## 2024-11-27 NOTE — Telephone Encounter (Signed)
 Please let the patient know: I have sent refill on Nebivolol  for 90 days.  She is due for office visit and repeat labs.  Recommend checking fasting lipid panel, A1c couple of days before her appointment with me. Please facilitate lab and office visit for the patient.   1. Diet-controlled type 2 diabetes mellitus (HCC) (Primary) - HgB A1c; Future  2. Hyperlipidemia associated with type 2 diabetes mellitus (HCC) - Lipid panel; Future   Luke Shade, MD

## 2024-11-30 ENCOUNTER — Ambulatory Visit: Admission: RE | Admit: 2024-11-30 | Discharge: 2024-11-30 | Disposition: A | Source: Ambulatory Visit

## 2024-11-30 DIAGNOSIS — Z1231 Encounter for screening mammogram for malignant neoplasm of breast: Secondary | ICD-10-CM | POA: Insufficient documentation

## 2024-12-02 ENCOUNTER — Ambulatory Visit: Payer: Self-pay

## 2024-12-07 ENCOUNTER — Other Ambulatory Visit

## 2024-12-09 ENCOUNTER — Other Ambulatory Visit

## 2024-12-09 DIAGNOSIS — E1169 Type 2 diabetes mellitus with other specified complication: Secondary | ICD-10-CM

## 2024-12-09 DIAGNOSIS — E119 Type 2 diabetes mellitus without complications: Secondary | ICD-10-CM

## 2024-12-09 DIAGNOSIS — E785 Hyperlipidemia, unspecified: Secondary | ICD-10-CM

## 2024-12-09 LAB — LIPID PANEL
Cholesterol: 107 mg/dL (ref 28–200)
HDL: 41.7 mg/dL
LDL Cholesterol: 46 mg/dL (ref 10–99)
NonHDL: 65.08
Total CHOL/HDL Ratio: 3
Triglycerides: 93 mg/dL (ref 10.0–149.0)
VLDL: 18.6 mg/dL (ref 0.0–40.0)

## 2024-12-09 LAB — HEMOGLOBIN A1C: Hgb A1c MFr Bld: 7.3 % — ABNORMAL HIGH (ref 4.6–6.5)

## 2024-12-10 ENCOUNTER — Ambulatory Visit: Payer: Self-pay

## 2024-12-10 DIAGNOSIS — E119 Type 2 diabetes mellitus without complications: Secondary | ICD-10-CM

## 2024-12-10 MED ORDER — METFORMIN HCL ER 500 MG PO TB24: 500.0000 mg | ORAL_TABLET | Freq: Every day | ORAL | 0 refills | Status: AC

## 2024-12-10 NOTE — Telephone Encounter (Signed)
 1. Type 2 diabetes mellitus without complication, without long-term current use of insulin (HCC) (Primary) - metFORMIN  (GLUCOPHAGE -XR) 500 MG 24 hr tablet; Take 1 tablet (500 mg total) by mouth daily with breakfast.  Dispense: 90 tablet; Refill: 0  Luke Shade, MD

## 2024-12-31 ENCOUNTER — Ambulatory Visit
